# Patient Record
Sex: Female | Born: 1990 | Race: White | Hispanic: No | Marital: Married | Smoking: Former smoker
Health system: Southern US, Community
[De-identification: ages and names within clinical notes are randomized; demographics above are authoritative.]

## PROBLEM LIST (undated history)

## (undated) DIAGNOSIS — F419 Anxiety disorder, unspecified: Secondary | ICD-10-CM

## (undated) HISTORY — PX: DENTAL SURGERY: SHX609

---

## 2012-02-11 DIAGNOSIS — O321XX0 Maternal care for breech presentation, not applicable or unspecified: Secondary | ICD-10-CM

## 2019-08-19 ENCOUNTER — Encounter (HOSPITAL_COMMUNITY)
Admission: EM | Disposition: A | Discharge status: Home / Self Care | Payer: Self-pay | Source: Home / Self Care | Attending: Emergency Medicine

## 2019-08-19 ENCOUNTER — Other Ambulatory Visit: Payer: Self-pay | Admitting: Obstetrics & Gynecology

## 2019-08-19 ENCOUNTER — Encounter (HOSPITAL_COMMUNITY): Payer: Self-pay

## 2019-08-19 ENCOUNTER — Emergency Department (HOSPITAL_COMMUNITY): Payer: Medicaid Other | Admitting: Certified Registered Nurse Anesthetist

## 2019-08-19 ENCOUNTER — Other Ambulatory Visit: Payer: Self-pay

## 2019-08-19 ENCOUNTER — Emergency Department (HOSPITAL_COMMUNITY): Payer: Medicaid Other

## 2019-08-19 ENCOUNTER — Ambulatory Visit (HOSPITAL_COMMUNITY)
Admission: EM | Admit: 2019-08-19 | Discharge: 2019-08-19 | Disposition: A | Discharge status: Home / Self Care | Payer: Medicaid Other | Attending: Emergency Medicine | Admitting: Emergency Medicine

## 2019-08-19 DIAGNOSIS — Z3A01 Less than 8 weeks gestation of pregnancy: Secondary | ICD-10-CM | POA: Insufficient documentation

## 2019-08-19 DIAGNOSIS — Z79899 Other long term (current) drug therapy: Secondary | ICD-10-CM | POA: Insufficient documentation

## 2019-08-19 DIAGNOSIS — F17200 Nicotine dependence, unspecified, uncomplicated: Secondary | ICD-10-CM | POA: Diagnosis not present

## 2019-08-19 DIAGNOSIS — Z20822 Contact with and (suspected) exposure to covid-19: Secondary | ICD-10-CM | POA: Diagnosis not present

## 2019-08-19 DIAGNOSIS — O219 Vomiting of pregnancy, unspecified: Secondary | ICD-10-CM | POA: Insufficient documentation

## 2019-08-19 DIAGNOSIS — O00102 Left tubal pregnancy without intrauterine pregnancy: Secondary | ICD-10-CM | POA: Insufficient documentation

## 2019-08-19 DIAGNOSIS — O009 Unspecified ectopic pregnancy without intrauterine pregnancy: Secondary | ICD-10-CM

## 2019-08-19 DIAGNOSIS — O99331 Smoking (tobacco) complicating pregnancy, first trimester: Secondary | ICD-10-CM | POA: Diagnosis not present

## 2019-08-19 HISTORY — PX: DIAGNOSTIC LAPAROSCOPY WITH REMOVAL OF ECTOPIC PREGNANCY: SHX6449

## 2019-08-19 LAB — COMPREHENSIVE METABOLIC PANEL
ALT: 14 U/L (ref 0–44)
AST: 16 U/L (ref 15–41)
Albumin: 4.4 g/dL (ref 3.5–5.0)
Alkaline Phosphatase: 61 U/L (ref 38–126)
Anion gap: 6 (ref 5–15)
BUN: 6 mg/dL (ref 6–20)
CO2: 29 mmol/L (ref 22–32)
Calcium: 9.5 mg/dL (ref 8.9–10.3)
Chloride: 103 mmol/L (ref 98–111)
Creatinine, Ser: 0.63 mg/dL (ref 0.44–1.00)
GFR calc Af Amer: >60 mL/min (ref >60)
GFR calc non Af Amer: >60 mL/min (ref >60)
Glucose, Bld: 84 mg/dL (ref 70–99)
Potassium: 4 mmol/L (ref 3.5–5.1)
Sodium: 138 mmol/L (ref 135–145)
Total Bilirubin: 0.5 mg/dL (ref 0.3–1.2)
Total Protein: 8.5 g/dL — ABNORMAL HIGH (ref 6.5–8.1)

## 2019-08-19 LAB — URINALYSIS, ROUTINE W REFLEX MICROSCOPIC
Bilirubin Urine: NEGATIVE
Glucose, UA: NEGATIVE mg/dL
Ketones, ur: NEGATIVE mg/dL
Leukocytes,Ua: NEGATIVE
Nitrite: NEGATIVE
Protein, ur: NEGATIVE mg/dL
Specific Gravity, Urine: 1.004 — ABNORMAL LOW (ref 1.005–1.030)
pH: 8 (ref 5.0–8.0)

## 2019-08-19 LAB — CBC
HCT: 36.8 % (ref 36.0–46.0)
Hemoglobin: 11.8 g/dL — ABNORMAL LOW (ref 12.0–15.0)
MCH: 26.8 pg (ref 26.0–34.0)
MCHC: 32.1 g/dL (ref 30.0–36.0)
MCV: 83.6 fL (ref 80.0–100.0)
Platelets: 260 10*3/uL (ref 150–400)
RBC: 4.4 MIL/uL (ref 3.87–5.11)
RDW: 15.6 % — ABNORMAL HIGH (ref 11.5–15.5)
WBC: 7.4 10*3/uL (ref 4.0–10.5)
nRBC: 0 % (ref 0.0–0.2)

## 2019-08-19 LAB — I-STAT BETA HCG BLOOD, ED (MC, WL, AP ONLY): I-stat hCG, quantitative: >2000 m[IU]/mL — ABNORMAL HIGH (ref <5)

## 2019-08-19 LAB — TYPE AND SCREEN
ABO/RH(D): A POS
Antibody Screen: NEGATIVE

## 2019-08-19 LAB — SARS CORONAVIRUS 2 BY RT PCR (HOSPITAL ORDER, PERFORMED IN ~~LOC~~ HOSPITAL LAB): SARS Coronavirus 2: NEGATIVE

## 2019-08-19 LAB — WET PREP, GENITAL
Clue Cells Wet Prep HPF POC: NONE SEEN
Sperm: NONE SEEN
Trich, Wet Prep: NONE SEEN

## 2019-08-19 LAB — LIPASE, BLOOD: Lipase: 22 U/L (ref 11–51)

## 2019-08-19 LAB — HCG, QUANTITATIVE, PREGNANCY: hCG, Beta Chain, Quant, S: 4507 m[IU]/mL — ABNORMAL HIGH (ref <5)

## 2019-08-19 LAB — ABO/RH: ABO/RH(D): A POS

## 2019-08-19 SURGERY — LAPAROSCOPY, WITH ECTOPIC PREGNANCY SURGICAL TREATMENT
Anesthesia: General | Site: Abdomen

## 2019-08-19 MED ORDER — LACTATED RINGERS IV SOLN: INTRAVENOUS | Status: DC | PRN

## 2019-08-19 MED ORDER — MIDAZOLAM HCL 5 MG/5ML IJ SOLN
INTRAMUSCULAR | Status: DC | PRN
  Administered 2019-08-19: 2 mg via INTRAVENOUS

## 2019-08-19 MED ORDER — SODIUM CHLORIDE 0.9% FLUSH: 3.0000 mL | Freq: Once | INTRAVENOUS | Status: DC

## 2019-08-19 MED ORDER — LIDOCAINE 2% (20 MG/ML) 5 ML SYRINGE
INTRAMUSCULAR | Status: DC | PRN
  Administered 2019-08-19: 40 mg via INTRAVENOUS

## 2019-08-19 MED ORDER — ONDANSETRON HCL 4 MG/2ML IJ SOLN
INTRAMUSCULAR | Status: DC | PRN
  Administered 2019-08-19: 4 mg via INTRAVENOUS

## 2019-08-19 MED ORDER — GLYCOPYRROLATE PF 0.2 MG/ML IJ SOSY
PREFILLED_SYRINGE | INTRAMUSCULAR | Status: DC | PRN
  Administered 2019-08-19: .2 mg via INTRAVENOUS

## 2019-08-19 MED ORDER — SODIUM CHLORIDE 0.9 % IR SOLN
Status: DC | PRN
  Administered 2019-08-19: 3000 mL

## 2019-08-19 MED ORDER — PHENYLEPHRINE HCL (PRESSORS) 10 MG/ML IV SOLN
INTRAVENOUS | Status: DC | PRN
  Administered 2019-08-19 (×2): 80 ug via INTRAVENOUS

## 2019-08-19 MED ORDER — ONDANSETRON HCL 4 MG/2ML IJ SOLN: 4.0000 mg | Freq: Four times a day (QID) | INTRAMUSCULAR | Status: DC | PRN

## 2019-08-19 MED ORDER — BUPIVACAINE HCL (PF) 0.25 % IJ SOLN
INTRAMUSCULAR | Status: DC | PRN
  Administered 2019-08-19: 8 mL

## 2019-08-19 MED ORDER — FENTANYL CITRATE (PF) 100 MCG/2ML IJ SOLN: 50.0000 ug | Freq: Once | INTRAMUSCULAR | Status: DC | PRN

## 2019-08-19 MED ORDER — ONDANSETRON HCL 4 MG/2ML IJ SOLN: 4.0000 mg | Freq: Once | INTRAMUSCULAR | Status: DC | PRN

## 2019-08-19 MED ORDER — ROCURONIUM BROMIDE 10 MG/ML (PF) SYRINGE
PREFILLED_SYRINGE | INTRAVENOUS | Status: DC | PRN
  Administered 2019-08-19: 50 mg via INTRAVENOUS

## 2019-08-19 MED ORDER — OXYCODONE HCL 5 MG PO TABS: 5.0000 mg | ORAL_TABLET | Freq: Once | ORAL | Status: DC | PRN

## 2019-08-19 MED ORDER — PROPOFOL 10 MG/ML IV BOLUS
INTRAVENOUS | Status: DC | PRN
  Administered 2019-08-19: 150 mg via INTRAVENOUS

## 2019-08-19 MED ORDER — EPHEDRINE SULFATE 50 MG/ML IJ SOLN
INTRAMUSCULAR | Status: DC | PRN
Start: 2019-08-19 — End: 2019-08-19
  Administered 2019-08-19 (×2): 5 mg via INTRAVENOUS

## 2019-08-19 MED ORDER — OXYCODONE-ACETAMINOPHEN 5-325 MG PO TABS: 1.0000 | ORAL_TABLET | Freq: Four times a day (QID) | ORAL | 0 refills | Status: DC | PRN

## 2019-08-19 MED ORDER — SUGAMMADEX SODIUM 200 MG/2ML IV SOLN
INTRAVENOUS | Status: DC | PRN
Start: 2019-08-19 — End: 2019-08-19
  Administered 2019-08-19: 200 mg via INTRAVENOUS

## 2019-08-19 MED ORDER — FENTANYL CITRATE (PF) 100 MCG/2ML IJ SOLN: 25.0000 ug | INTRAMUSCULAR | Status: DC | PRN

## 2019-08-19 MED ORDER — FENTANYL CITRATE (PF) 100 MCG/2ML IJ SOLN
INTRAMUSCULAR | Status: DC | PRN
  Administered 2019-08-19 (×2): 50 ug via INTRAVENOUS
  Administered 2019-08-19: 100 ug via INTRAVENOUS
  Administered 2019-08-19: 50 ug via INTRAVENOUS

## 2019-08-19 MED ORDER — OXYCODONE HCL 5 MG/5ML PO SOLN: 5.0000 mg | Freq: Once | ORAL | Status: DC | PRN

## 2019-08-19 MED ORDER — IBUPROFEN 600 MG PO TABS
600.0000 mg | ORAL_TABLET | Freq: Four times a day (QID) | ORAL | 1 refills | Status: DC | PRN
Start: 2019-08-19 — End: 2019-09-12

## 2019-08-19 MED ORDER — SUCCINYLCHOLINE 20MG/ML (10ML) SYRINGE FOR MEDFUSION PUMP - OPTIME
INTRAMUSCULAR | Status: DC | PRN
  Administered 2019-08-19: 100 mg via INTRAVENOUS

## 2019-08-19 SURGICAL SUPPLY — 31 items
CABLE HIGH FREQUENCY MONO STRZ (ELECTRODE) IMPLANT
CLOSURE WOUND 1/2 X4 (GAUZE/BANDAGES/DRESSINGS)
DERMABOND ADVANCED (GAUZE/BANDAGES/DRESSINGS) ×2
DERMABOND ADVANCED .7 DNX12 (GAUZE/BANDAGES/DRESSINGS) ×1 IMPLANT
DURAPREP 26ML APPLICATOR (WOUND CARE) ×3 IMPLANT
GLOVE BIO SURGEON STRL SZ 6.5 (GLOVE) ×2 IMPLANT
GLOVE BIO SURGEONS STRL SZ 6.5 (GLOVE) ×1
GLOVE BIOGEL PI IND STRL 7.0 (GLOVE) ×4 IMPLANT
GLOVE BIOGEL PI INDICATOR 7.0 (GLOVE) ×8
GOWN STRL REUS W/ TWL LRG LVL3 (GOWN DISPOSABLE) ×2 IMPLANT
GOWN STRL REUS W/TWL LRG LVL3 (GOWN DISPOSABLE) ×4
KIT TURNOVER KIT B (KITS) ×3 IMPLANT
NEEDLE INSUFFLATION 14GA 120MM (NEEDLE) ×3 IMPLANT
NS IRRIG 1000ML POUR BTL (IV SOLUTION) ×3 IMPLANT
PACK LAPAROSCOPY BASIN (CUSTOM PROCEDURE TRAY) ×3 IMPLANT
PACK TRENDGUARD 450 HYBRID PRO (MISCELLANEOUS) ×1 IMPLANT
POUCH SPECIMEN RETRIEVAL 10MM (ENDOMECHANICALS) ×3 IMPLANT
PROTECTOR NERVE ULNAR (MISCELLANEOUS) ×3 IMPLANT
SET IRRIG TUBING LAPAROSCOPIC (IRRIGATION / IRRIGATOR) ×3 IMPLANT
SET TUBE SMOKE EVAC HIGH FLOW (TUBING) ×3 IMPLANT
SHEARS HARMONIC ACE PLUS 36CM (ENDOMECHANICALS) ×3 IMPLANT
SLEEVE ENDOPATH XCEL 5M (ENDOMECHANICALS) ×3 IMPLANT
SLEEVE SCD COMPRESS KNEE MED (MISCELLANEOUS) ×3 IMPLANT
STRIP CLOSURE SKIN 1/2X4 (GAUZE/BANDAGES/DRESSINGS) IMPLANT
SUT VICRYL 0 UR6 27IN ABS (SUTURE) ×6 IMPLANT
SUT VICRYL 4-0 PS2 18IN ABS (SUTURE) ×3 IMPLANT
TOWEL GREEN STERILE FF (TOWEL DISPOSABLE) ×6 IMPLANT
TRAY FOLEY W/BAG SLVR 14FR (SET/KITS/TRAYS/PACK) ×3 IMPLANT
TRENDGUARD 450 HYBRID PRO PACK (MISCELLANEOUS) ×3
TROCAR XCEL DIL TIP R 11M (ENDOMECHANICALS) ×3 IMPLANT
WARMER LAPAROSCOPE (MISCELLANEOUS) ×3 IMPLANT

## 2019-08-19 NOTE — Progress Notes (Signed)
Tracy Cooper is an 29 y.o. female. G4P3  Patient's last menstrual period was 06/19/2019. Transferred from [facility] with US showing suspected ruptured ectopic pregnancy. She is to have LS removal of the ectopic and she consents to salpingectomy, and would prefer sterilization but insurance will not cover this.   Pertinent Gynecological History: Menses: flow is light and regular every month without intermenstrual spotting Contraception: none Blood transfusions: none Sexually transmitted diseases: no past history Last pap: normal   Menstrual History:  Patient's last menstrual period was 06/19/2019.    History reviewed. No pertinent past medical history.  Past Surgical History:  Procedure Laterality Date  . CESAREAN SECTION     x 3    Family History  Problem Relation Age of Onset  . COPD Mother     Social History:  reports that she has been smoking. She has never used smokeless tobacco. She reports that she does not drink alcohol and does not use drugs.  Allergies: No Known Allergies  No medications prior to admission.    Review of Systems  Constitutional: Negative.   Gastrointestinal: Positive for abdominal pain.  Genitourinary: Positive for pelvic pain and vaginal bleeding (spotting).    Blood pressure 137/79, pulse 73, temperature (!) 97.5 F (36.4 C), temperature source Oral, resp. rate 18, height 5\' 2"  (1.575 m), weight 78.9 kg, last menstrual period 06/19/2019, SpO2 100 %. Physical Exam Vitals and nursing note reviewed. Exam conducted with a chaperone present.  Cardiovascular:     Rate and Rhythm: Normal rate.  Pulmonary:     Effort: Pulmonary effort is normal.  Abdominal:     General: Abdomen is flat. A surgical scar is present.     Palpations: Abdomen is soft.  Skin:    General: Skin is warm and dry.  Neurological:     Mental Status: She is alert.  Psychiatric:        Mood and Affect: Mood normal.        Behavior: Behavior normal.     Results  for orders placed or performed during the hospital encounter of 08/19/19 (from the past 24 hour(s))  Urinalysis, Routine w reflex microscopic     Status: Abnormal   Collection Time: 08/19/19  3:33 PM  Result Value Ref Range   Color, Urine YELLOW YELLOW   APPearance CLEAR CLEAR   Specific Gravity, Urine 1.004 (L) 1.005 - 1.030   pH 8.0 5.0 - 8.0   Glucose, UA NEGATIVE NEGATIVE mg/dL   Hgb urine dipstick LARGE (A) NEGATIVE   Bilirubin Urine NEGATIVE NEGATIVE   Ketones, ur NEGATIVE NEGATIVE mg/dL   Protein, ur NEGATIVE NEGATIVE mg/dL   Nitrite NEGATIVE NEGATIVE   Leukocytes,Ua NEGATIVE NEGATIVE   RBC / HPF 21-50 0 - 5 RBC/hpf   WBC, UA 0-5 0 - 5 WBC/hpf   Bacteria, UA RARE (A) NONE SEEN   Squamous Epithelial / LPF 0-5 0 - 5   Ca Oxalate Crys, UA PRESENT   Lipase, blood     Status: None   Collection Time: 08/19/19  3:43 PM  Result Value Ref Range   Lipase 22 11 - 51 U/L  Comprehensive metabolic panel     Status: Abnormal   Collection Time: 08/19/19  3:43 PM  Result Value Ref Range   Sodium 138 135 - 145 mmol/L   Potassium 4.0 3.5 - 5.1 mmol/L   Chloride 103 98 - 111 mmol/L   CO2 29 22 - 32 mmol/L   Glucose, Bld 84 70 - 99  mg/dL   BUN 6 6 - 20 mg/dL   Creatinine, Ser 7.67 0.44 - 1.00 mg/dL   Calcium 9.5 8.9 - 34.1 mg/dL   Total Protein 8.5 (H) 6.5 - 8.1 g/dL   Albumin 4.4 3.5 - 5.0 g/dL   AST 16 15 - 41 U/L   ALT 14 0 - 44 U/L   Alkaline Phosphatase 61 38 - 126 U/L   Total Bilirubin 0.5 0.3 - 1.2 mg/dL   GFR calc non Af Amer >60 >60 mL/min   GFR calc Af Amer >60 >60 mL/min   Anion gap 6 5 - 15  CBC     Status: Abnormal   Collection Time: 08/19/19  3:43 PM  Result Value Ref Range   WBC 7.4 4.0 - 10.5 K/uL   RBC 4.40 3.87 - 5.11 MIL/uL   Hemoglobin 11.8 (L) 12.0 - 15.0 g/dL   HCT 93.7 36 - 46 %   MCV 83.6 80.0 - 100.0 fL   MCH 26.8 26.0 - 34.0 pg   MCHC 32.1 30.0 - 36.0 g/dL   RDW 90.2 (H) 40.9 - 73.5 %   Platelets 260 150 - 400 K/uL   nRBC 0.0 0.0 - 0.2 %  hCG,  quantitative, pregnancy     Status: Abnormal   Collection Time: 08/19/19  3:43 PM  Result Value Ref Range   hCG, Beta Chain, Quant, S 4,507 (H) <5 mIU/mL  I-Stat beta hCG blood, ED     Status: Abnormal   Collection Time: 08/19/19  3:48 PM  Result Value Ref Range   I-stat hCG, quantitative >2,000.0 (H) <5 mIU/mL   Comment 3          Wet prep, genital     Status: Abnormal   Collection Time: 08/19/19  4:44 PM   Specimen: PATH Cytology Cervicovaginal Ancillary Only  Result Value Ref Range   Yeast Wet Prep HPF POC PRESENT (A) NONE SEEN   Trich, Wet Prep NONE SEEN NONE SEEN   Clue Cells Wet Prep HPF POC NONE SEEN NONE SEEN   WBC, Wet Prep HPF POC FEW (A) NONE SEEN   Sperm NONE SEEN   SARS Coronavirus 2 by RT PCR (hospital order, performed in [facility] hospital lab) Nasopharyngeal Nasopharyngeal Swab     Status: None   Collection Time: 08/19/19  6:10 PM   Specimen: Nasopharyngeal Swab  Result Value Ref Range   SARS Coronavirus 2 NEGATIVE NEGATIVE    US OB Comp < 14 Wks  Result Date: 08/19/2019 CLINICAL DATA:  Pelvic pain and vaginal bleeding for 3 days. Beta HCG 4,507. Last menstrual period 06/19/2019. EXAM: OBSTETRIC <14 WK [place] AND TRANSVAGINAL OB US TECHNIQUE: Both transabdominal and transvaginal ultrasound examinations were performed for complete evaluation of the gestation as well as the maternal uterus, adnexal regions, and pelvic cul-de-sac. Transvaginal technique was performed to assess early pregnancy. COMPARISON:  None. FINDINGS: Intrauterine gestational sac: None Yolk sac:  Not Visualized. Embryo:  Not Visualized. Cardiac Activity: Not Visualized. Subchorionic hemorrhage:  None visualized. Maternal uterus/adnexae: The endometrium measures 14 mm in thickness. A right ovarian cyst versus dominant follicle measures 2.1 x 1.6 x 1.9 cm. A rounded structure is seen in the left adnexa measuring 3.2 x 2.5 cm. There is moderate volume free intraperitoneal fluid with debris, concerning for blood.  IMPRESSION: No intrauterine gestational sac is identified. A rounded structure is seen in the left adnexa and complex fluid is seen within the pelvis which is concerning for blood products. This constellation  of findings is suspicious for a ruptured ectopic pregnancy. These results were called by telephone at the time of interpretation on 08/19/2019 at 6:02 pm to provider ABIGAIL HARRIS , who verbally acknowledged these results. Electronically Signed   By: Romona Curls M.D.   On: 08/19/2019 18:07   US OB Transvaginal  Result Date: 08/19/2019 CLINICAL DATA:  Pelvic pain and vaginal bleeding for 3 days. Beta HCG 4,507. Last menstrual period 06/19/2019. EXAM: OBSTETRIC <14 WK [place] AND TRANSVAGINAL OB US TECHNIQUE: Both transabdominal and transvaginal ultrasound examinations were performed for complete evaluation of the gestation as well as the maternal uterus, adnexal regions, and pelvic cul-de-sac. Transvaginal technique was performed to assess early pregnancy. COMPARISON:  None. FINDINGS: Intrauterine gestational sac: None Yolk sac:  Not Visualized. Embryo:  Not Visualized. Cardiac Activity: Not Visualized. Subchorionic hemorrhage:  None visualized. Maternal uterus/adnexae: The endometrium measures 14 mm in thickness. A right ovarian cyst versus dominant follicle measures 2.1 x 1.6 x 1.9 cm. A rounded structure is seen in the left adnexa measuring 3.2 x 2.5 cm. There is moderate volume free intraperitoneal fluid with debris, concerning for blood. IMPRESSION: No intrauterine gestational sac is identified. A rounded structure is seen in the left adnexa and complex fluid is seen within the pelvis which is concerning for blood products. This constellation of findings is suspicious for a ruptured ectopic pregnancy. These results were called by telephone at the time of interpretation on 08/19/2019 at 6:02 pm to provider ABIGAIL HARRIS , who verbally acknowledged these results. Electronically Signed   By: Romona Curls M.D.    On: 08/19/2019 18:07    Assessment/Plan: Suspected ruptured ectopic pregnancy. She was transported from [facility] with the plan to perform laparoscopic removal of ectopic pregnancy with possible salpingectomy. Patient desires surgical management .  The risks of surgery were discussed in detail with the patient including but not limited to: bleeding which may require transfusion or reoperation; infection which may require prolonged hospitalization or re-hospitalization and antibiotic therapy; injury to bowel, bladder, ureters and major vessels or other surrounding organs; formation of adhesions; need for additional procedures including laparotomy; thromboembolic phenomenon; incisional problems and other postoperative or anesthesia complications.  Patient was told that the likelihood that her condition and symptoms will be treated effectively with this surgical management was very high; the postoperative expectations were also discussed in detail. The patient also understands the alternative treatment options which were discussed in full. All questions were answered.    Scheryl Darter 08/19/2019, 7:16 PM

## 2019-08-19 NOTE — ED Provider Notes (Signed)
Medical screening examination/treatment/procedure(s) were conducted as a shared visit with non-physician practitioner(s) and myself.  I personally evaluated the patient during the encounter.    Patient has taken a home positive pregnancy test.  LMP [phone].  Patient reports 3 days ago she got some gradual abdominal discomfort and felt a little bit like menstrual cramps.  It was not severe.  Last night she had an episode of severe pain that seem to be somewhat to the left and central abdomen.  It eased somewhat by morning but has persisted and very painful with any movements.  Patient is alert and appropriate.  No respiratory distress.  Mental status clear.  Color good.  Abdomen is soft but very tender in the left lateral and lower quadrant as well as suprapubic.  Ultrasound results pending.  I agree with plan of management.   Arby Barrette, MD 08/19/19 [phone]

## 2019-08-19 NOTE — Discharge Instructions (Signed)
Diagnostic Laparoscopy, Care After This sheet gives you information about how to care for yourself after your procedure. Your health care provider may also give you more specific instructions. If you have problems or questions, contact your health care provider. What can I expect after the procedure? After the procedure, it is common to have:  Mild discomfort in the abdomen.  Sore throat. Women who have laparoscopy with pelvic examination may have mild cramping and fluid coming from the vagina for a few days after the procedure. Follow these instructions at home: Medicines  Take over-the-counter and prescription medicines only as told by your health care provider.  If you were prescribed an antibiotic medicine, take it as told by your health care provider. Do not stop taking the antibiotic even if you start to feel better. Driving  Do not drive for 24 hours if you were given a medicine to help you relax (sedative) during your procedure.  Do not drive or use heavy machinery while taking prescription pain medicine. Bathing  Do not take baths, swim, or use a hot tub until your health care provider approves. You may take showers. Incision care   Follow instructions from your health care provider about how to take care of your incisions. Make sure you: ? Wash your hands with soap and water before you change your bandage (dressing). If soap and water are not available, use hand sanitizer. ? Change your dressing as told by your health care provider. ? Leave stitches (sutures), skin glue, or adhesive strips in place. These skin closures may need to stay in place for 2 weeks or longer. If adhesive strip edges start to loosen and curl up, you may trim the loose edges. Do not remove adhesive strips completely unless your health care provider tells you to do that.  Check your incision areas every day for signs of infection. Check for: ? Redness, swelling, or pain. ? Fluid or  blood. ? Warmth. ? Pus or a bad smell. Activity  Return to your normal activities as told by your health care provider. Ask your health care provider what activities are safe for you.  Do not lift anything that is heavier than 10 lb (4.5 kg), or the limit that you are told, until your health care provider says that it is safe. General instructions  To prevent or treat constipation while you are taking prescription pain medicine, your health care provider may recommend that you: ? Drink enough fluid to keep your urine pale yellow. ? Take over-the-counter or prescription medicines. ? Eat foods that are high in fiber, such as fresh fruits and vegetables, whole grains, and beans. ? Limit foods that are high in fat and processed sugars, such as fried and sweet foods.  Do not use any products that contain nicotine or tobacco, such as cigarettes and e-cigarettes. If you need help quitting, ask your health care provider.  Keep all follow-up visits as told by your health care provider. This is important. Contact a health care provider if:  You develop shoulder pain.  You feel lightheaded or faint.  You are unable to pass gas or have a bowel movement.  You feel nauseous or you vomit.  You develop a rash.  You have redness, swelling, or pain around any incision.  You have fluid or blood coming from any incision.  Any incision feels warm to the touch.  You have pus or a bad smell coming from any incision.  You have a fever or chills. Get help   right away if:  You have severe pain.  You have vomiting that does not go away.  You have heavy bleeding from the vagina.  Any incision opens.  You have trouble breathing.  You have chest pain. Summary  After the procedure, it is common to have mild discomfort in the abdomen and a sore throat.  Check your incision areas every day for signs of infection.  Return to your normal activities as told by your health care provider. Ask  your health care provider what activities are safe for you. This information is not intended to replace advice given to you by your health care provider. Make sure you discuss any questions you have with your health care provider. Document Revised: 01/13/2017 Document Reviewed: 07/27/2016 Elsevier Patient Education  2020 [employer] Ruptured Ectopic Pregnancy  An ectopic pregnancy is when a fertilized egg attaches (implants) outside of the uterus, usually in a fallopian tube. A ruptured ectopic pregnancy is when the fallopian tube tears or bursts. This results in internal bleeding, intense abdominal pain, and sometimes, vaginal bleeding. Most ectopic pregnancies occur in the fallopian tube. In rare cases, it may occur on the ovary, intestine, pelvis, or cervix. An ectopic pregnancy does not have the ability to develop into a normal, healthy baby. A ruptured ectopic pregnancy can affect your ability to have children (fertility), depending on damage it causes to your reproductive organs. Ruptured ectopic pregnancy is a medical emergency. If not treated immediately, it can lead to blood loss, shock, or even death. What are the causes? Most ectopic pregnancies are caused by damage to the fallopian tubes. The damage prevents the fertilized egg from implanting in the uterus. In some cases, the cause may not be known. What increases the risk? You are at increased risk for an ectopic pregnancy if:  You have had a previous ectopic pregnancy.  You have had previous fallopian tube surgery.  You have had previous surgery to have the fallopian tubes tied (tubal ligation).  You have had infertility treatments or have a history of infertility.  You have been exposed to DES. DES is a medicine that was used until 1971 and had effects on babies whose mothers took the medicine.  You use an IUD (intrauterine device) for birth control.  You use progestin-only oral contraception for birth control.  You have  a history of pelvic inflammatory disease (PID).  You have a history of endometriosis.  You smoke.  You became sexually active before 28 years of age.  You have multiple sexual partners. What are the signs or symptoms? Symptoms of a ruptured ectopic pregnancy and internal bleeding may include:  Sudden, severe pain in the abdomen and pelvis.  Dizziness or fainting.  Pain in the shoulder area.  Vaginal bleeding. How is this diagnosed? This condition is diagnosed based on your medical history, symptoms, a physical exam, and tests, which may include:  A pregnancy test.  An ultrasound.  Measuring the levels of the pregnancy hormone in the bloodstream.  Taking a sample of tissue from the uterus (dilation and curettage, D&C).  Surgery to visually examine the inside of the abdomen using a lighted tube (laparoscopy). How is this treated? This condition is treated with IV fluids and emergency surgery to remove the ectopic pregnancy and repair the area where the rupture occured. If you have lost a lot of blood, you may need a blood transfusion. If you are Rh negative and your baby's father is Rh positive, or the Rh type of the  father is unknown, you may receive a Rho (D) immune globulin shot. This is to prevent Rh problems in future pregnancies. Additional medicines may be given. Get help right away if:  You are taking medicines to treat an ectopic pregnancy and you develop symptoms of a rupture. These include: ? Fever or chills. ? Shoulder pain. ? Vaginal bleeding. ? Nausea and vomiting. ? Severe abdominal pain or cramping. ? Feeling light-headed or fainting. Summary  An ectopic pregnancy is when a fertilized egg attaches (implants) outside of the uterus, usually in a fallopian tube. A ruptured ectopic pregnancy is when the fallopian tube tears or bursts.  Ruptured ectopic pregnancy is a medical emergency. If not treated immediately, it can lead to blood loss, shock, or even  death.  This condition is treated with IV fluids and emergency surgery to remove the ectopic pregnancy and repair the area where the rupture occured. If you have lost a lot of blood, you may need a blood transfusion. This information is not intended to replace advice given to you by your health care provider. Make sure you discuss any questions you have with your health care provider. Document Revised: 01/13/2017 Document Reviewed: 04/20/2016 Elsevier Patient Education  2020 [employer].

## 2019-08-19 NOTE — Transfer of Care (Signed)
Immediate Anesthesia Transfer of Care Note  Patient: Tracy Cooper  Procedure(s) Performed: DIAGNOSTIC LAPAROSCOPY WITH Left Salpingectomy and  REMOVAL OF ECTOPIC PREGNANCY (N/A Abdomen)  Patient Location: PACU  Anesthesia Type:General  Level of Consciousness: awake, alert , oriented and patient cooperative  Airway & Oxygen Therapy: Patient Spontanous Breathing  Post-op Assessment: Report given to RN and Post -op Vital signs reviewed and stable  Post vital signs: Reviewed and stable  Last Vitals:  Vitals Value Taken Time  BP 108/56   Temp    Pulse 75 08/19/19 2121  Resp 19 08/19/19 2121  SpO2 100 % 08/19/19 2121  Vitals shown include unvalidated device data.  Last Pain:  Vitals:   08/19/19 1822  TempSrc:   PainSc: 7          Complications: No complications documented.

## 2019-08-19 NOTE — ED Notes (Signed)
[facility] OR given report.

## 2019-08-19 NOTE — H&P (View-Only) (Signed)
Tracy Cooper is an 29 y.o. female. G4P3  Patient's last menstrual period was 06/19/2019. Transferred from [facility] showing suspected ruptured ectopic pregnancy. She is to have LS removal of the ectopic and she consents to salpingectomy, and would prefer sterilization but insurance will not cover this.   Pertinent Gynecological History: Menses: flow is light and regular every month without intermenstrual spotting Contraception: none Blood transfusions: none Sexually transmitted diseases: no past history Last pap: normal   Menstrual History:  Patient's last menstrual period was 06/19/2019.    History reviewed. No pertinent past medical history.  Past Surgical History:  Procedure Laterality Date  . CESAREAN SECTION     x 3    Family History  Problem Relation Age of Onset  . COPD Mother     Social History:  reports that she has been smoking. She has never used smokeless tobacco. She reports that she does not drink alcohol and does not use drugs.  Allergies: No Known Allergies  No medications prior to admission.    Review of Systems  Constitutional: Negative.   Gastrointestinal: Positive for abdominal pain.  Genitourinary: Positive for pelvic pain and vaginal bleeding (spotting).    Blood pressure 137/79, pulse 73, temperature (!) 97.5 F (36.4 C), temperature source Oral, resp. rate 18, height 5' 2" (1.575 m), weight 78.9 kg, last menstrual period 06/19/2019, SpO2 100 %. Physical Exam Vitals and nursing note reviewed. Exam conducted with a chaperone present.  Cardiovascular:     Rate and Rhythm: Normal rate.  Pulmonary:     Effort: Pulmonary effort is normal.  Abdominal:     General: Abdomen is flat. A surgical scar is present.     Palpations: Abdomen is soft.  Skin:    General: Skin is warm and dry.  Neurological:     Mental Status: She is alert.  Psychiatric:        Mood and Affect: Mood normal.        Behavior: Behavior normal.     Results  for orders placed or performed during the hospital encounter of 08/19/19 (from the past 24 hour(s))  Urinalysis, Routine w reflex microscopic     Status: Abnormal   Collection Time: 08/19/19  3:33 PM  Result Value Ref Range   Color, Urine YELLOW YELLOW   APPearance CLEAR CLEAR   Specific Gravity, Urine 1.004 (L) 1.005 - 1.030   pH 8.0 5.0 - 8.0   Glucose, UA NEGATIVE NEGATIVE mg/dL   Hgb urine dipstick LARGE (A) NEGATIVE   Bilirubin Urine NEGATIVE NEGATIVE   Ketones, ur NEGATIVE NEGATIVE mg/dL   Protein, ur NEGATIVE NEGATIVE mg/dL   Nitrite NEGATIVE NEGATIVE   Leukocytes,Ua NEGATIVE NEGATIVE   RBC / HPF 21-50 0 - 5 RBC/hpf   WBC, UA 0-5 0 - 5 WBC/hpf   Bacteria, UA RARE (A) NONE SEEN   Squamous Epithelial / LPF 0-5 0 - 5   Ca Oxalate Crys, UA PRESENT   Lipase, blood     Status: None   Collection Time: 08/19/19  3:43 PM  Result Value Ref Range   Lipase 22 11 - 51 U/L  Comprehensive metabolic panel     Status: Abnormal   Collection Time: 08/19/19  3:43 PM  Result Value Ref Range   Sodium 138 135 - 145 mmol/L   Potassium 4.0 3.5 - 5.1 mmol/L   Chloride 103 98 - 111 mmol/L   CO2 29 22 - 32 mmol/L   Glucose, Bld 84 70 - 99   mg/dL   BUN 6 6 - 20 mg/dL   Creatinine, Ser 7.67 0.44 - 1.00 mg/dL   Calcium 9.5 8.9 - 34.1 mg/dL   Total Protein 8.5 (H) 6.5 - 8.1 g/dL   Albumin 4.4 3.5 - 5.0 g/dL   AST 16 15 - 41 U/L   ALT 14 0 - 44 U/L   Alkaline Phosphatase 61 38 - 126 U/L   Total Bilirubin 0.5 0.3 - 1.2 mg/dL   GFR calc non Af Amer >60 >60 mL/min   GFR calc Af Amer >60 >60 mL/min   Anion gap 6 5 - 15  CBC     Status: Abnormal   Collection Time: 08/19/19  3:43 PM  Result Value Ref Range   WBC 7.4 4.0 - 10.5 K/uL   RBC 4.40 3.87 - 5.11 MIL/uL   Hemoglobin 11.8 (L) 12.0 - 15.0 g/dL   HCT 93.7 36 - 46 %   MCV 83.6 80.0 - 100.0 fL   MCH 26.8 26.0 - 34.0 pg   MCHC 32.1 30.0 - 36.0 g/dL   RDW 90.2 (H) 40.9 - 73.5 %   Platelets 260 150 - 400 K/uL   nRBC 0.0 0.0 - 0.2 %  hCG,  quantitative, pregnancy     Status: Abnormal   Collection Time: 08/19/19  3:43 PM  Result Value Ref Range   hCG, Beta Chain, Quant, S 4,507 (H) <5 mIU/mL  I-Stat beta hCG blood, ED     Status: Abnormal   Collection Time: 08/19/19  3:48 PM  Result Value Ref Range   I-stat hCG, quantitative >2,000.0 (H) <5 mIU/mL   Comment 3          Wet prep, genital     Status: Abnormal   Collection Time: 08/19/19  4:44 PM   Specimen: PATH Cytology Cervicovaginal Ancillary Only  Result Value Ref Range   Yeast Wet Prep HPF POC PRESENT (A) NONE SEEN   Trich, Wet Prep NONE SEEN NONE SEEN   Clue Cells Wet Prep HPF POC NONE SEEN NONE SEEN   WBC, Wet Prep HPF POC FEW (A) NONE SEEN   Sperm NONE SEEN   SARS Coronavirus 2 by RT PCR (hospital order, performed in [facility] hospital lab) Nasopharyngeal Nasopharyngeal Swab     Status: None   Collection Time: 08/19/19  6:10 PM   Specimen: Nasopharyngeal Swab  Result Value Ref Range   SARS Coronavirus 2 NEGATIVE NEGATIVE    US OB Comp < 14 Wks  Result Date: 08/19/2019 CLINICAL DATA:  Pelvic pain and vaginal bleeding for 3 days. Beta HCG 4,507. Last menstrual period 06/19/2019. EXAM: OBSTETRIC <14 WK [place] AND TRANSVAGINAL OB US TECHNIQUE: Both transabdominal and transvaginal ultrasound examinations were performed for complete evaluation of the gestation as well as the maternal uterus, adnexal regions, and pelvic cul-de-sac. Transvaginal technique was performed to assess early pregnancy. COMPARISON:  None. FINDINGS: Intrauterine gestational sac: None Yolk sac:  Not Visualized. Embryo:  Not Visualized. Cardiac Activity: Not Visualized. Subchorionic hemorrhage:  None visualized. Maternal uterus/adnexae: The endometrium measures 14 mm in thickness. A right ovarian cyst versus dominant follicle measures 2.1 x 1.6 x 1.9 cm. A rounded structure is seen in the left adnexa measuring 3.2 x 2.5 cm. There is moderate volume free intraperitoneal fluid with debris, concerning for blood.  IMPRESSION: No intrauterine gestational sac is identified. A rounded structure is seen in the left adnexa and complex fluid is seen within the pelvis which is concerning for blood products. This constellation  of findings is suspicious for a ruptured ectopic pregnancy. These results were called by telephone at the time of interpretation on 08/19/2019 at 6:02 pm to provider ABIGAIL HARRIS , who verbally acknowledged these results. Electronically Signed   By: Romona Curls M.D.   On: 08/19/2019 18:07   US OB Transvaginal  Result Date: 08/19/2019 CLINICAL DATA:  Pelvic pain and vaginal bleeding for 3 days. Beta HCG 4,507. Last menstrual period 06/19/2019. EXAM: OBSTETRIC <14 WK [place] AND TRANSVAGINAL OB US TECHNIQUE: Both transabdominal and transvaginal ultrasound examinations were performed for complete evaluation of the gestation as well as the maternal uterus, adnexal regions, and pelvic cul-de-sac. Transvaginal technique was performed to assess early pregnancy. COMPARISON:  None. FINDINGS: Intrauterine gestational sac: None Yolk sac:  Not Visualized. Embryo:  Not Visualized. Cardiac Activity: Not Visualized. Subchorionic hemorrhage:  None visualized. Maternal uterus/adnexae: The endometrium measures 14 mm in thickness. A right ovarian cyst versus dominant follicle measures 2.1 x 1.6 x 1.9 cm. A rounded structure is seen in the left adnexa measuring 3.2 x 2.5 cm. There is moderate volume free intraperitoneal fluid with debris, concerning for blood. IMPRESSION: No intrauterine gestational sac is identified. A rounded structure is seen in the left adnexa and complex fluid is seen within the pelvis which is concerning for blood products. This constellation of findings is suspicious for a ruptured ectopic pregnancy. These results were called by telephone at the time of interpretation on 08/19/2019 at 6:02 pm to provider ABIGAIL HARRIS , who verbally acknowledged these results. Electronically Signed   By: Romona Curls M.D.    On: 08/19/2019 18:07    Assessment/Plan: Suspected ruptured ectopic pregnancy. She was transported from [facility] with the plan to perform laparoscopic removal of ectopic pregnancy with possible salpingectomy. Patient desires surgical management .  The risks of surgery were discussed in detail with the patient including but not limited to: bleeding which may require transfusion or reoperation; infection which may require prolonged hospitalization or re-hospitalization and antibiotic therapy; injury to bowel, bladder, ureters and major vessels or other surrounding organs; formation of adhesions; need for additional procedures including laparotomy; thromboembolic phenomenon; incisional problems and other postoperative or anesthesia complications.  Patient was told that the likelihood that her condition and symptoms will be treated effectively with this surgical management was very high; the postoperative expectations were also discussed in detail. The patient also understands the alternative treatment options which were discussed in full. All questions were answered.    Scheryl Darter 08/19/2019, 7:16 PM

## 2019-08-19 NOTE — ED Notes (Signed)
[facility] given report.  

## 2019-08-19 NOTE — Interval H&P Note (Signed)
History and Physical Interval Note:  08/19/2019 7:29 PM  Tracy Cooper  has presented today for surgery, with the diagnosis of ABDOMINAL PAIN.  The various methods of treatment have been discussed with the patient and family. After consideration of risks, benefits and other options for treatment, the patient has consented to  Procedure(s): DIAGNOSTIC LAPAROSCOPY WITH REMOVAL OF ECTOPIC PREGNANCY (N/A) as a surgical intervention.  The patient's history has been reviewed, patient examined, no change in status, stable for surgery.  I have reviewed the patient's chart and labs.  Questions were answered to the patient's satisfaction.     Scheryl Darter

## 2019-08-19 NOTE — Anesthesia Procedure Notes (Signed)
Procedure Name: Intubation Date/Time: 08/19/2019 7:50 PM Performed by: Oletta Lamas, CRNA Pre-anesthesia Checklist: Patient identified, Emergency Drugs available, Suction available and Patient being monitored Patient Re-evaluated:Patient Re-evaluated prior to induction Oxygen Delivery Method: Circle System Utilized Preoxygenation: Pre-oxygenation with 100% oxygen Induction Type: IV induction Ventilation: Mask ventilation without difficulty Laryngoscope Size: Mac and 3 Grade View: Grade I Tube type: Oral Number of attempts: 1 Airway Equipment and Method: Stylet Placement Confirmation: ETT inserted through vocal cords under direct vision,  positive ETCO2 and breath sounds checked- equal and bilateral Secured at: 22 cm Tube secured with: Tape Dental Injury: Teeth and Oropharynx as per pre-operative assessment

## 2019-08-19 NOTE — ED Notes (Signed)
[facility] called, Tracy Cooper accepting. Patient to go to [facility] OR per PA. Papers at bedside.

## 2019-08-19 NOTE — Progress Notes (Deleted)
Tracy Cooper is an 29 y.o. female. No obstetric history on file. Transferred from [facility] for ruptured ectopic pregnancy., Patient's last menstrual period was 06/19/2019.  She consents to salpingectomy and does not want to conceive again. Pertinent Gynecological History:   Patient's last menstrual period was 06/19/2019.    History reviewed. No pertinent past medical history.  Past Surgical History:  Procedure Laterality Date  . CESAREAN SECTION     x 3    Family History  Problem Relation Age of Onset  . COPD Mother     Social History:  reports that she has been smoking. She has never used smokeless tobacco. She reports that she does not drink alcohol and does not use drugs.  Allergies: No Known Allergies  No medications prior to admission.    Review of Systems  Constitutional: Negative.   Gastrointestinal: Positive for abdominal pain.  Genitourinary: Positive for pelvic pain and vaginal bleeding (spotting).    Blood pressure 137/79, pulse 73, temperature (!) 97.5 F (36.4 C), temperature source Oral, resp. rate 18, height 5\' 2"  (1.575 m), weight 78.9 kg, last menstrual period 06/19/2019, SpO2 100 %. Physical Exam Vitals and nursing note reviewed.  Constitutional:      Appearance: She is well-developed.  HENT:     Head: Normocephalic.  Cardiovascular:     Rate and Rhythm: Normal rate.  Abdominal:     General: Abdomen is flat. A surgical scar is present.     Palpations: Abdomen is soft.  Skin:    General: Skin is warm and dry.  Neurological:     Mental Status: She is alert.     Results for orders placed or performed during the hospital encounter of 08/19/19 (from the past 24 hour(s))  Urinalysis, Routine w reflex microscopic     Status: Abnormal   Collection Time: 08/19/19  3:33 PM  Result Value Ref Range   Color, Urine YELLOW YELLOW   APPearance CLEAR CLEAR   Specific Gravity, Urine 1.004 (L) 1.005 - 1.030   pH 8.0 5.0 - 8.0   Glucose, UA NEGATIVE  NEGATIVE mg/dL   Hgb urine dipstick LARGE (A) NEGATIVE   Bilirubin Urine NEGATIVE NEGATIVE   Ketones, ur NEGATIVE NEGATIVE mg/dL   Protein, ur NEGATIVE NEGATIVE mg/dL   Nitrite NEGATIVE NEGATIVE   Leukocytes,Ua NEGATIVE NEGATIVE   RBC / HPF 21-50 0 - 5 RBC/hpf   WBC, UA 0-5 0 - 5 WBC/hpf   Bacteria, UA RARE (A) NONE SEEN   Squamous Epithelial / LPF 0-5 0 - 5   Ca Oxalate Crys, UA PRESENT   Lipase, blood     Status: None   Collection Time: 08/19/19  3:43 PM  Result Value Ref Range   Lipase 22 11 - 51 U/L  Comprehensive metabolic panel     Status: Abnormal   Collection Time: 08/19/19  3:43 PM  Result Value Ref Range   Sodium 138 135 - 145 mmol/L   Potassium 4.0 3.5 - 5.1 mmol/L   Chloride 103 98 - 111 mmol/L   CO2 29 22 - 32 mmol/L   Glucose, Bld 84 70 - 99 mg/dL   BUN 6 6 - 20 mg/dL   Creatinine, Ser 10/20/19 0.44 - 1.00 mg/dL   Calcium 9.5 8.9 - 3.54 mg/dL   Total Protein 8.5 (H) 6.5 - 8.1 g/dL   Albumin 4.4 3.5 - 5.0 g/dL   AST 16 15 - 41 U/L   ALT 14 0 - 44 U/L   Alkaline Phosphatase  61 38 - 126 U/L   Total Bilirubin 0.5 0.3 - 1.2 mg/dL   GFR calc non Af Amer >60 >60 mL/min   GFR calc Af Amer >60 >60 mL/min   Anion gap 6 5 - 15  CBC     Status: Abnormal   Collection Time: 08/19/19  3:43 PM  Result Value Ref Range   WBC 7.4 4.0 - 10.5 K/uL   RBC 4.40 3.87 - 5.11 MIL/uL   Hemoglobin 11.8 (L) 12.0 - 15.0 g/dL   HCT 37.8 36 - 46 %   MCV 83.6 80.0 - 100.0 fL   MCH 26.8 26.0 - 34.0 pg   MCHC 32.1 30.0 - 36.0 g/dL   RDW 58.8 (H) 50.2 - 77.4 %   Platelets 260 150 - 400 K/uL   nRBC 0.0 0.0 - 0.2 %  hCG, quantitative, pregnancy     Status: Abnormal   Collection Time: 08/19/19  3:43 PM  Result Value Ref Range   hCG, Beta Chain, Quant, S 4,507 (H) <5 mIU/mL  I-Stat beta hCG blood, ED     Status: Abnormal   Collection Time: 08/19/19  3:48 PM  Result Value Ref Range   I-stat hCG, quantitative >2,000.0 (H) <5 mIU/mL   Comment 3          Wet prep, genital     Status: Abnormal    Collection Time: 08/19/19  4:44 PM   Specimen: PATH Cytology Cervicovaginal Ancillary Only  Result Value Ref Range   Yeast Wet Prep HPF POC PRESENT (A) NONE SEEN   Trich, Wet Prep NONE SEEN NONE SEEN   Clue Cells Wet Prep HPF POC NONE SEEN NONE SEEN   WBC, Wet Prep HPF POC FEW (A) NONE SEEN   Sperm NONE SEEN   SARS Coronavirus 2 by RT PCR (hospital order, performed in [facility] hospital lab) Nasopharyngeal Nasopharyngeal Swab     Status: None   Collection Time: 08/19/19  6:10 PM   Specimen: Nasopharyngeal Swab  Result Value Ref Range   SARS Coronavirus 2 NEGATIVE NEGATIVE    US OB Comp < 14 Wks  Result Date: 08/19/2019 CLINICAL DATA:  Pelvic pain and vaginal bleeding for 3 days. Beta HCG 4,507. Last menstrual period 06/19/2019. EXAM: OBSTETRIC <14 WK [place] AND TRANSVAGINAL OB US TECHNIQUE: Both transabdominal and transvaginal ultrasound examinations were performed for complete evaluation of the gestation as well as the maternal uterus, adnexal regions, and pelvic cul-de-sac. Transvaginal technique was performed to assess early pregnancy. COMPARISON:  None. FINDINGS: Intrauterine gestational sac: None Yolk sac:  Not Visualized. Embryo:  Not Visualized. Cardiac Activity: Not Visualized. Subchorionic hemorrhage:  None visualized. Maternal uterus/adnexae: The endometrium measures 14 mm in thickness. A right ovarian cyst versus dominant follicle measures 2.1 x 1.6 x 1.9 cm. A rounded structure is seen in the left adnexa measuring 3.2 x 2.5 cm. There is moderate volume free intraperitoneal fluid with debris, concerning for blood. IMPRESSION: No intrauterine gestational sac is identified. A rounded structure is seen in the left adnexa and complex fluid is seen within the pelvis which is concerning for blood products. This constellation of findings is suspicious for a ruptured ectopic pregnancy. These results were called by telephone at the time of interpretation on 08/19/2019 at 6:02 pm to provider ABIGAIL  HARRIS , who verbally acknowledged these results. Electronically Signed   By: Romona Curls M.D.   On: 08/19/2019 18:07   US OB Transvaginal  Result Date: 08/19/2019 CLINICAL DATA:  Pelvic pain and  vaginal bleeding for 3 days. Beta HCG 4,507. Last menstrual period 06/19/2019. EXAM: OBSTETRIC <14 WK [place] AND TRANSVAGINAL OB US TECHNIQUE: Both transabdominal and transvaginal ultrasound examinations were performed for complete evaluation of the gestation as well as the maternal uterus, adnexal regions, and pelvic cul-de-sac. Transvaginal technique was performed to assess early pregnancy. COMPARISON:  None. FINDINGS: Intrauterine gestational sac: None Yolk sac:  Not Visualized. Embryo:  Not Visualized. Cardiac Activity: Not Visualized. Subchorionic hemorrhage:  None visualized. Maternal uterus/adnexae: The endometrium measures 14 mm in thickness. A right ovarian cyst versus dominant follicle measures 2.1 x 1.6 x 1.9 cm. A rounded structure is seen in the left adnexa measuring 3.2 x 2.5 cm. There is moderate volume free intraperitoneal fluid with debris, concerning for blood. IMPRESSION: No intrauterine gestational sac is identified. A rounded structure is seen in the left adnexa and complex fluid is seen within the pelvis which is concerning for blood products. This constellation of findings is suspicious for a ruptured ectopic pregnancy. These results were called by telephone at the time of interpretation on 08/19/2019 at 6:02 pm to provider ABIGAIL HARRIS , who verbally acknowledged these results. Electronically Signed   By: Romona Curls M.D.   On: 08/19/2019 18:07    Assessment/Plan:  Patient desires surgical management with laparoscopic salpingectomy.  The risks of surgery were discussed in detail with the patient including but not limited to: bleeding which may require transfusion or reoperation; infection which may require prolonged hospitalization or re-hospitalization and antibiotic therapy; injury to  bowel, bladder, ureters and major vessels or other surrounding organs; formation of adhesions; need for additional procedures including laparotomy; thromboembolic phenomenon; incisional problems and other postoperative or anesthesia complications.  Patient was told that the likelihood that her condition and symptoms will be treated effectively with this surgical management was very high; the postoperative expectations were also discussed in detail.  Scheryl Darter 08/19/2019, 7:23 PM

## 2019-08-19 NOTE — ED Triage Notes (Signed)
Patient reports that she began having vaginal bleeding and abdominal pain x 3 days. Patient states she is [redacted] weeks pregnant. Patient states she has had 4 + Home pregnancy tests.

## 2019-08-19 NOTE — ED Provider Notes (Signed)
[name] Provider Note   CSN: [id number] Arrival date & time: 08/19/19  1427     History Chief Complaint  Patient presents with  . Vaginal Bleeding  . Abdominal Pain  . [redacted] weeks pregnant    Tracy Cooper is a 29 y.o. female who presents emergency department with chief complaint of vaginal bleeding.  Patient states that she has taken several home pregnancy tests that have been positive.  Her last menstrual period was 06/19/2019.  She has 3 previous pregnancies.  Patient states that she began having spotting 3 days ago, yesterday her bleeding increased and overnight her pain became severe.  She states that it was so bad that she vomited.  She has not been passing clots but she has had a lot of bleeding.   Pain is worse with any movement or jarring. She is very tearful.  She denies discharge or urinary symptoms.  HPI     History reviewed. No pertinent past medical history.  There are no problems to display for this patient.   Past Surgical History:  Procedure Laterality Date  . CESAREAN SECTION     x 3     OB History   No obstetric history on file.     Family History  Problem Relation Age of Onset  . COPD Mother     Social History   Tobacco Use  . Smoking status: Current Every Day Smoker  . Smokeless tobacco: Never Used  Vaping Use  . Vaping Use: Every day  . Substances: Nicotine, Flavoring  Substance Use Topics  . Alcohol use: Never  . Drug use: Never    Home Medications Prior to Admission medications   Not on File    Allergies    Patient has no known allergies.  Review of Systems   Review of Systems Ten systems reviewed and are negative for acute change, except as noted in the HPI.   Physical Exam Updated Vital Signs BP 137/79 (BP Location: Left Arm)   Pulse 73   Temp (!) 97.5 F (36.4 C) (Oral)   Resp 18   Ht 5\' 2"  (1.575 m) Comment: Simultaneous filing. User may not have seen previous data.  Wt 78.9  kg Comment: Simultaneous filing. User may not have seen previous data.  LMP 06/19/2019   SpO2 100%   BMI 31.83 kg/m   Physical Exam Vitals and nursing note reviewed. Exam conducted with a chaperone present.  Constitutional:      General: She is not in acute distress.    Appearance: She is well-developed. She is not diaphoretic.  HENT:     Head: Normocephalic and atraumatic.  Eyes:     General: No scleral icterus.    Conjunctiva/sclera: Conjunctivae normal.  Cardiovascular:     Rate and Rhythm: Normal rate and regular rhythm.     Heart sounds: Normal heart sounds. No murmur heard.  No friction rub. No gallop.   Pulmonary:     Effort: Pulmonary effort is normal. No respiratory distress.     Breath sounds: Normal breath sounds.  Abdominal:     General: Bowel sounds are normal. There is no distension.     Palpations: Abdomen is soft. There is no mass.     Tenderness: There is abdominal tenderness in the right lower quadrant, suprapubic area and left lower quadrant. There is guarding and rebound.  Genitourinary:    General: Normal vulva.     Exam position: Lithotomy position.  Vagina: Normal.     Cervix: Cervical bleeding present. No cervical motion tenderness.     Uterus: Tender.      Adnexa:        Right: Tenderness present.        Left: Tenderness present.   Musculoskeletal:     Cervical back: Normal range of motion.  Skin:    General: Skin is warm and dry.  Neurological:     Mental Status: She is alert and oriented to person, place, and time.  Psychiatric:        Behavior: Behavior normal.     ED Results / Procedures / Treatments   Labs (all labs ordered are listed, but only abnormal results are displayed) Labs Reviewed  WET PREP, GENITAL - Abnormal; Notable for the following components:      Result Value   Yeast Wet Prep HPF POC PRESENT (*)    WBC, Wet Prep HPF POC FEW (*)    All other components within normal limits  COMPREHENSIVE METABOLIC PANEL -  Abnormal; Notable for the following components:   Total Protein 8.5 (*)    All other components within normal limits  CBC - Abnormal; Notable for the following components:   Hemoglobin 11.8 (*)    RDW 15.6 (*)    All other components within normal limits  URINALYSIS, ROUTINE W REFLEX MICROSCOPIC - Abnormal; Notable for the following components:   Specific Gravity, Urine 1.004 (*)    Hgb urine dipstick LARGE (*)    Bacteria, UA RARE (*)    All other components within normal limits  HCG, QUANTITATIVE, PREGNANCY - Abnormal; Notable for the following components:   hCG, Beta Chain, Quant, S 4,507 (*)    All other components within normal limits  I-STAT BETA HCG BLOOD, ED ([facility], [facility], [facility] ONLY) - Abnormal; Notable for the following components:   I-stat hCG, quantitative >2,000.0 (*)    All other components within normal limits  SARS CORONAVIRUS 2 BY RT PCR (HOSPITAL ORDER, PERFORMED IN [facility])  LIPASE, BLOOD  GC/CHLAMYDIA PROBE AMP ([place]) NOT AT [facility]    EKG None  Radiology US OB Comp < 14 Wks  Result Date: 08/19/2019 CLINICAL DATA:  Pelvic pain and vaginal bleeding for 3 days. Beta HCG 4,507. Last menstrual period 06/19/2019. EXAM: OBSTETRIC <14 WK [place] AND TRANSVAGINAL OB US TECHNIQUE: Both transabdominal and transvaginal ultrasound examinations were performed for complete evaluation of the gestation as well as the maternal uterus, adnexal regions, and pelvic cul-de-sac. Transvaginal technique was performed to assess early pregnancy. COMPARISON:  None. FINDINGS: Intrauterine gestational sac: None Yolk sac:  Not Visualized. Embryo:  Not Visualized. Cardiac Activity: Not Visualized. Subchorionic hemorrhage:  None visualized. Maternal uterus/adnexae: The endometrium measures 14 mm in thickness. A right ovarian cyst versus dominant follicle measures 2.1 x 1.6 x 1.9 cm. A rounded structure is seen in the left adnexa measuring 3.2 x 2.5 cm. There is moderate volume free  intraperitoneal fluid with debris, concerning for blood. IMPRESSION: No intrauterine gestational sac is identified. A rounded structure is seen in the left adnexa and complex fluid is seen within the pelvis which is concerning for blood products. This constellation of findings is suspicious for a ruptured ectopic pregnancy. These results were called by telephone at the time of interpretation on 08/19/2019 at 6:02 pm to provider Chandra Asher , who verbally acknowledged these results. Electronically Signed   By: Romona Curls M.D.   On: 08/19/2019 18:07   US OB  Transvaginal  Result Date: 08/19/2019 CLINICAL DATA:  Pelvic pain and vaginal bleeding for 3 days. Beta HCG 4,507. Last menstrual period 06/19/2019. EXAM: OBSTETRIC <14 WK [place] AND TRANSVAGINAL OB US TECHNIQUE: Both transabdominal and transvaginal ultrasound examinations were performed for complete evaluation of the gestation as well as the maternal uterus, adnexal regions, and pelvic cul-de-sac. Transvaginal technique was performed to assess early pregnancy. COMPARISON:  None. FINDINGS: Intrauterine gestational sac: None Yolk sac:  Not Visualized. Embryo:  Not Visualized. Cardiac Activity: Not Visualized. Subchorionic hemorrhage:  None visualized. Maternal uterus/adnexae: The endometrium measures 14 mm in thickness. A right ovarian cyst versus dominant follicle measures 2.1 x 1.6 x 1.9 cm. A rounded structure is seen in the left adnexa measuring 3.2 x 2.5 cm. There is moderate volume free intraperitoneal fluid with debris, concerning for blood. IMPRESSION: No intrauterine gestational sac is identified. A rounded structure is seen in the left adnexa and complex fluid is seen within the pelvis which is concerning for blood products. This constellation of findings is suspicious for a ruptured ectopic pregnancy. These results were called by telephone at the time of interpretation on 08/19/2019 at 6:02 pm to provider Sadler Teschner , who verbally acknowledged these  results. Electronically Signed   By: Romona Curls M.D.   On: 08/19/2019 18:07    Procedures .Critical Care Performed by: Arthor Captain, PA-C Authorized by: Arthor Captain, PA-C   Critical care provider statement:    Critical care time (minutes):  60   Critical care time was exclusive of:  Separately billable procedures and treating other patients   Critical care was necessary to treat or prevent imminent or life-threatening deterioration of the following conditions: Ruptured ectopic pregnancy.   Critical care was time spent personally by me on the following activities:  Discussions with consultants, evaluation of patient's response to treatment, examination of patient, ordering and performing treatments and interventions, ordering and review of laboratory studies, ordering and review of radiographic studies, pulse oximetry, re-evaluation of patient's condition, obtaining history from patient or surrogate and review of old charts   (including critical care time)  Medications Ordered in ED Medications  sodium chloride flush (NS) 0.9 % injection 3 mL ( Intravenous MAR Hold 08/19/19 1941)  fentaNYL (SUBLIMAZE) injection 50 mcg ( Intravenous MAR Hold 08/19/19 1941)    Or  ondansetron (ZOFRAN) injection 4 mg ( Intravenous MAR Hold 08/19/19 1941)    ED Course  I have reviewed the triage vital signs and the nursing notes.  Pertinent labs & imaging results that were available during my care of the patient were reviewed by me and considered in my medical decision making (see chart for details).  Clinical Course as of Aug 18 1949  Mon Aug 19, 2019  1816 Discussed the case with Dr. De Nurse of radiology who called emergently to report findings of left-sided ruptured ectopic pregnancy. Case discussed with Dr. Debroah Loop of [facility] practice who is excepted the patient in emergent transfer.   [AH]    Clinical Course User Index [AH] Arthor Captain, PA-C   MDM Rules/Calculators/A&P                           UJ:WJXBJY pain and vaginal bleeding in pregnancy VS: BP 137/79 (BP Location: Left Arm)   Pulse 73   Temp (!) 97.5 F (36.4 C) (Oral)   Resp 18   Ht 5\' 2"  (1.575 m) Comment: Simultaneous filing. User may not have seen previous data.  Wt 78.9 kg Comment: Simultaneous filing. User may not have seen previous data.  LMP 06/19/2019   SpO2 100%   BMI 31.83 kg/m   ZO:[id number]:History is gathered by patient  and triage note. Previous records obtained and reviewed. DDX:The patient's complaint of Pelvic pain involves an extensive number of diagnostic and treatment options, and is a complaint that carries with it a high risk of complications, morbidity, and potential mortality. Given the large differential diagnosis, medical decision making is of high complexity. The differential diagnosis for vaginal bleeding in pregnancy less than 20 weeks includes but is not limited to the following: Ectopic pregnancy, Subchorionic hematoma, First Trimester Abortion, Gestational trophoblastic disease, Heterotopic pregnancy, Implantation bleeding, Molar pregnancy, Cervicitis, Fibroids, Vaginal Trauma  Labs: I ordered reviewed and interpreted labs which include wet prep which is positive for yeast and white cells.  Patient's beta hCG at 4500 which is low for her estimated 8-week pregnancy.  CMP without significant abnormality.  CBC with hemoglobin of 11.8 and insignificant.  Urine without evidence of infection. Imaging: I ordered and reviewed images which included pelvic and transvaginal ultrasound. I independently visualized and interpreted all imaging. Significant findings include apparent ruptured ectopic.  EKG: N/A Consults:Dr. Debroah LoopArnold with OB gyn MDM: Patient with ruptured ectopic pregnancy.  Discussed the case with Dr. Debroah LoopArnold who will accept the patient.  She is stable for emergent transfer to [facility].  Answered all the patient's questions to the best of my ability. Patient  disposition:Emergent Transfer        Final Clinical Impression(s) / ED Diagnoses Final diagnoses:  Ruptured ectopic pregnancy    Rx / DC Orders ED Discharge Orders    None       Arthor CaptainHarris, Barb Shear, PA-C 08/19/19 1955    Arby BarrettePfeiffer, Marcy, MD 08/19/19 2003

## 2019-08-19 NOTE — Op Note (Signed)
Tracy Cooper PROCEDURE DATE: 08/19/2019  PREOPERATIVE DIAGNOSIS: Ruptured ectopic pregnancy POSTOPERATIVE DIAGNOSIS: Ruptured left fallopian tube ectopic pregnancy PROCEDURE: Laparoscopic left salpingectomy and removal of ectopic pregnancy SURGEON:  Adam Phenix, MD ANESTHESIOLOGIST: Achille Rich, MD Anesthesiologist: Achille Rich, MD CRNA: Tillman Abide, CRNA  INDICATIONS: 29 y.o. [phone] here with the preoperative diagnoses as listed above.  Please refer to preoperative notes for more details. Patient was counseled regarding need for laparoscopic salpingectomy. Risks of surgery including bleeding which may require transfusion or reoperation, infection, injury to bowel or other surrounding organs, need for additional procedures including laparotomy and other postoperative/anesthesia complications were explained to patient.  Written informed consent was obtained.  FINDINGS:  moderate amount of hemoperitoneum estimated to be about 75 ml of blood and clots.  Dilated left fallopian tube containing ectopic gestation. Small normal appearing uterus, normal right fallopian tube, right ovary and left ovary.  ANESTHESIA: General INTRAVENOUS FLUIDS: 600 ml ESTIMATED BLOOD LOSS: 75 ml URINE OUTPUT: 300 ml SPECIMENS: Left fallopian tube containing ectopic gestation COMPLICATIONS: None immediate  PROCEDURE IN DETAIL:  The patient was taken to the operating room where general anesthesia was administered and was found to be adequate.  She was placed in the dorsal lithotomy position, and was prepped and draped in a sterile manner.  A Foley catheter was inserted into her bladder and attached to constant drainage and a uterine manipulator was then advanced into the uterus .    After an adequate timeout was performed, attention was turned to the abdomen where an umbilical incision was made with the scalpel. Veress needle was placed to insufflate.The Optiview 11-mm trocar and sleeve were then  advanced without difficulty with the laparoscope under direct visualization into the abdomen.  A survey of the patient's pelvis and abdomen revealed the findings above.   5-mm left and right lower quadrant ports were then placed under direct visualization.  The Nezhat suction irrigator was then used to suction the hemoperitoneum and irrigate the pelvis.  Attention was then turned to the left fallopian tube which was grasped and ligated from the underlying mesosalpinx and uterine attachment using the Harmonic instrument. A few adhesions were seen and lysed.  Good hemostasis was noted.  The specimen was placed in an EndoCatch bag and removed from the abdomen intact.  The abdomen was desufflated, and all instruments were removed.  The fascial incision of the 10-mm site was reapproximated with a 0 Vicryl figure-of-eight stitch; and all skin incisions were closed with Dermabond. The patient tolerated the procedure well.  All instruments, needles, and sponge counts were correct x 2. The patient was taken to the recovery room in stable condition.   The patient will be discharged to home as per PACU criteria.  Routine postoperative instructions given.  She was prescribed Percocet, and Ibuprofen.  She will follow up in the clinic in about 2-3 weeks for postoperative evaluation.   Adam Phenix, MD 08/19/2019 9:05 PM

## 2019-08-19 NOTE — Anesthesia Preprocedure Evaluation (Signed)
Anesthesia Evaluation  Patient identified by MRN, date of birth, ID band Patient awake    Reviewed: Allergy & Precautions, H&P , NPO status , Patient's Chart, lab work & pertinent test results  Airway Mallampati: II   Neck ROM: full    Dental   Pulmonary Current Smoker,    breath sounds clear to auscultation       Cardiovascular negative cardio ROS   Rhythm:regular Rate:Normal     Neuro/Psych    GI/Hepatic   Endo/Other    Renal/GU      Musculoskeletal   Abdominal   Peds  Hematology   Anesthesia Other Findings   Reproductive/Obstetrics Ectopic pregnancy                             Anesthesia Physical Anesthesia Plan  ASA: II and emergent  Anesthesia Plan: General   Post-op Pain Management:    Induction: Intravenous and Rapid sequence  PONV Risk Score and Plan: 2 and Ondansetron, Dexamethasone, Midazolam and Treatment may vary due to age or medical condition  Airway Management Planned: Oral ETT  Additional Equipment:   Intra-op Plan:   Post-operative Plan: Extubation in OR  Informed Consent: I have reviewed the patients History and Physical, chart, labs and discussed the procedure including the risks, benefits and alternatives for the proposed anesthesia with the patient or authorized representative who has indicated his/her understanding and acceptance.       Plan Discussed with: CRNA, Anesthesiologist and Surgeon  Anesthesia Plan Comments:         Anesthesia Quick Evaluation

## 2019-08-20 ENCOUNTER — Encounter (HOSPITAL_COMMUNITY): Payer: Self-pay | Admitting: Obstetrics & Gynecology

## 2019-08-20 LAB — GC/CHLAMYDIA PROBE AMP (~~LOC~~) NOT AT ARMC
Chlamydia: NEGATIVE
Comment: NEGATIVE
Comment: NORMAL
Neisseria Gonorrhea: NEGATIVE

## 2019-08-21 LAB — SURGICAL PATHOLOGY

## 2019-08-21 NOTE — Anesthesia Postprocedure Evaluation (Signed)
Anesthesia Post Note  Patient: Tracy Cooper  Procedure(s) Performed: DIAGNOSTIC LAPAROSCOPY WITH Left Salpingectomy and  REMOVAL OF ECTOPIC PREGNANCY (N/A Abdomen)     Patient location during evaluation: PACU Anesthesia Type: General Level of consciousness: awake and alert Pain management: pain level controlled Vital Signs Assessment: post-procedure vital signs reviewed and stable Respiratory status: spontaneous breathing, nonlabored ventilation, respiratory function stable and patient connected to nasal cannula oxygen Cardiovascular status: blood pressure returned to baseline and stable Postop Assessment: no apparent nausea or vomiting Anesthetic complications: no   No complications documented.  Last Vitals:  Vitals:   08/19/19 2215 08/19/19 2245  BP: 106/64 108/64  Pulse: 74 75  Resp: 20 20  Temp:  36.9 C  SpO2: 100% 100%    Last Pain:  Vitals:   08/19/19 2130  TempSrc:   PainSc: Asleep                 Kaneesha Constantino S

## 2019-09-04 ENCOUNTER — Telehealth (INDEPENDENT_AMBULATORY_CARE_PROVIDER_SITE_OTHER): Payer: Medicaid Other

## 2019-09-04 DIAGNOSIS — Z9889 Other specified postprocedural states: Secondary | ICD-10-CM

## 2019-09-04 NOTE — Telephone Encounter (Signed)
Pt called requesting to set up an appt.  Per chart review, pt has an appt already scheduled for 09/12/19 @ 1015.  Called pt and informed her that she already has an appt scheduled.  Appt time provided to pt.  Pt verbalized understanding with no further questions or concerns.    Addison Naegeli, RN  09/04/19

## 2019-09-12 ENCOUNTER — Other Ambulatory Visit (HOSPITAL_COMMUNITY)
Admission: RE | Admit: 2019-09-12 | Discharge: 2019-09-12 | Disposition: A | Discharge status: Home / Self Care | Payer: Medicaid Other | Source: Ambulatory Visit | Attending: Obstetrics & Gynecology | Admitting: Obstetrics & Gynecology

## 2019-09-12 ENCOUNTER — Other Ambulatory Visit: Payer: Self-pay

## 2019-09-12 ENCOUNTER — Ambulatory Visit (INDEPENDENT_AMBULATORY_CARE_PROVIDER_SITE_OTHER): Payer: Medicaid Other | Admitting: Obstetrics & Gynecology

## 2019-09-12 VITALS — BP 112/75 | HR 76 | Ht 62.0 in | Wt 170.0 lb

## 2019-09-12 DIAGNOSIS — Z302 Encounter for sterilization: Secondary | ICD-10-CM

## 2019-09-12 DIAGNOSIS — O00102 Left tubal pregnancy without intrauterine pregnancy: Secondary | ICD-10-CM

## 2019-09-12 DIAGNOSIS — Z01419 Encounter for gynecological examination (general) (routine) without abnormal findings: Secondary | ICD-10-CM | POA: Insufficient documentation

## 2019-09-12 DIAGNOSIS — Z9889 Other specified postprocedural states: Secondary | ICD-10-CM

## 2019-09-12 DIAGNOSIS — K661 Hemoperitoneum: Secondary | ICD-10-CM | POA: Insufficient documentation

## 2019-09-12 MED ORDER — MEDROXYPROGESTERONE ACETATE 150 MG/ML IM SUSP
150.0000 mg | Freq: Once | INTRAMUSCULAR | Status: AC
  Administered 2019-09-12: 150 mg via INTRAMUSCULAR

## 2019-09-12 NOTE — Progress Notes (Signed)
Subjective:wants Depo Provera and to schedule tubal ligation     Tracy Cooper is a 29 y.o. female who presents to the clinic 4 weeks status post laparoscopy and   for left ectopic pregnancy. Eating a regular diet without difficulty. Bowel movements are normal. The patient is not having any pain.  The following portions of the patient's history were reviewed and updated as appropriate: allergies, current medications, past family history, past medical history, past social history, past surgical history and problem list.  Review of Systems Pertinent items are noted in HPI.    Objective:    BP 112/75   Pulse 76   Ht 5\' 2"  (1.575 m)   Wt 170 lb (77.1 kg)   BMI 31.09 kg/m  General:  alert, cooperative and no distress  Abdomen: soft, bowel sounds active, non-tender  Incision:   healing well, no drainage, no erythema, no hernia, no seroma, no swelling, no dehiscence, incision well approximated     Assessment:    Doing well postoperatively. Operative findings again reviewed. Pathology report discussed.    Plan:    1. Continue any current medications. 2. Wound care discussed. 3. Activity restrictions: none 4. Anticipated return to work: now. 5. Follow up: a few weeks for tubal ligation 6.Sign BTL papers. 29 y.o. . with undesired fertility desires permanent sterilization. Risks and benefits of laparoscopic tubal sterilization procedure was discussed with the patient including permanence of method, bleeding, infection, injury to surrounding organs, anesthesia and need for additional procedures. Risk failure of 0.5-1% with increased risk of ectopic gestation if pregnancy occurs was also discussed with patient. Patient verbalized understanding and all questions were answered.  26 Tracy Paris MD 09/12/2019     Patient ID: 09/14/2019, female   DOB: 20-Feb-1990, 29 y.o.   MRN: [id number]

## 2019-09-12 NOTE — Progress Notes (Signed)
Patient has elevated gad7- will come back to see Asher Muir

## 2019-09-12 NOTE — Progress Notes (Signed)
Patient ID: Tracy Cooper, female   DOB: 12-09-1990, 29 y.o.   MRN: [id number]

## 2019-09-12 NOTE — Patient Instructions (Signed)

## 2019-09-13 ENCOUNTER — Encounter: Payer: Self-pay | Admitting: *Deleted

## 2019-09-13 LAB — CYTOLOGY - PAP: Diagnosis: NEGATIVE

## 2019-09-16 ENCOUNTER — Ambulatory Visit (INDEPENDENT_AMBULATORY_CARE_PROVIDER_SITE_OTHER): Payer: Medicaid Other | Admitting: Clinical

## 2019-09-16 ENCOUNTER — Other Ambulatory Visit: Payer: Self-pay

## 2019-09-16 DIAGNOSIS — F419 Anxiety disorder, unspecified: Secondary | ICD-10-CM | POA: Diagnosis not present

## 2019-09-16 NOTE — BH Specialist Note (Signed)
Integrated Behavioral Health via Telemedicine Video (Caregility) Visit  09/16/2019 Tracy Cooper [id number]  Number of Integrated Behavioral Health visits: 1 Session Start time: 2:22   Session End time: 3:16 Total time: 71  Referring Provider: Scheryl Darter, MD Type of Visit: Video Patient/Family location: Home  [facility] Provider location: [facility]  All persons participating in visit: Patient Tracy Cooper and [facility] Cadey Bazile    Confirmed patient's address: Yes  Confirmed patient's phone number: Yes  Any changes to demographics: No   Confirmed patient's insurance: Yes  Any changes to patient's insurance: No   Discussed confidentiality: Yes   I connected with Tracy Cooper by a video enabled telemedicine application (Caregility) and verified that I am speaking with the correct person using two identifiers.     I discussed the limitations of evaluation and management by telemedicine and the availability of in person appointments.  I discussed that the purpose of this visit is to provide behavioral health care while limiting exposure to the novel coronavirus.   Discussed there is a possibility of technology failure and discussed alternative modes of communication if that failure occurs.  I discussed that engaging in this virtual visit, they consent to the provision of behavioral healthcare and the services will be billed under their insurance.  Patient and/or legal guardian expressed understanding and consented to virtual visit: Yes   PRESENTING CONCERNS: Patient and/or family reports the following symptoms/concerns: Pt states her primary concern today is  panic that occurs when driving, going into public spaces, and being alone, along with an increase in anxiety with change in routine or disorder in household; pt has been taking Celexa 40mg  since 29yo and is no longer working to manage symptoms. Pt uses many  self-coping strategies throughout the years that have been helpful. Pt prefers no referral to psychiatry.  Duration of problem: Ongoing; Severity of problem: moderate  STRENGTHS (Protective Factors/Coping Skills): Utilizes daily self-coping strategies (self-talk, humor, reframing, grounding); adhering to treatment via BH medication  GOALS ADDRESSED: Patient will: 1.  Reduce symptoms of: anxiety, compulsions and stress  2.  Demonstrate ability to: Increase healthy adjustment to current life circumstances  INTERVENTIONS: Interventions utilized:  Medication Monitoring and Psychoeducation and/or Health Education Standardized Assessments completed: GAD-7 and PHQ 9  ASSESSMENT: Patient currently experiencing Anxiety disorder, unspecified.   Patient may benefit from psychoeducation and brief therapeutic interventions regarding coping with symptoms of anxiety with panic attack .  PLAN: 1. Follow up with behavioral health clinician on : One week 2. Behavioral recommendations:  -Continue taking BH medication as prescribed; you will be contacted about any recommended changes in medication -Continue using daily self-coping strategies that are helping to manage symptoms 3. Referral(s): Integrated (In Clinic)  I discussed the assessment and treatment plan with the patient and/or parent/guardian. They were provided an opportunity to ask questions and all were answered. They agreed with the plan and demonstrated an understanding of the instructions.   They were advised to call back or seek an in-person evaluation if the symptoms worsen or if the condition fails to improve as anticipated.  [employer]  Depression screen [facility] 2/9 09/12/2019  Decreased Interest 0  Down, Depressed, Hopeless 0  PHQ - 2 Score 0  Altered sleeping 1  Tired, decreased energy 1  Change in appetite 0  Feeling bad or failure about yourself  0  Trouble concentrating 0  Moving slowly or  fidgety/restless 0  Suicidal thoughts 0  PHQ-9 Score 2   GAD 7 : Generalized Anxiety Score 09/12/2019  Nervous, Anxious, on Edge 2  Control/stop worrying 2  Worry too much - different things 2  Trouble relaxing 1  Restless 1  Easily annoyed or irritable 1  Afraid - awful might happen 1  Total GAD 7 Score 10

## 2019-09-16 NOTE — Patient Instructions (Signed)
[facility]  [facility] [address] [place], [place] [address] [phone] (main office) [phone] (Blayne Frankie's office)  Coping with Panic Attacks   What is a panic attack?  You may have had a panic attack if you experienced four or more of the symptoms listed below coming on abruptly and peaking in about 10 minutes.  Panic Symptoms   . Pounding heart  . Sweating  . Trembling or shaking  . Shortness of breath  . Feeling of choking  . Chest pain  . Nausea or abdominal distress    . Feeling dizzy, unsteady, lightheaded, or faint  . Feelings of unreality or being detached from yourself  . Fear of losing control or going crazy  . Fear of dying  . Numbness or tingling  . Chills or hot flashes      Panic attacks are sometimes accompanied by avoidance of certain places or situations. These are often situations that would be difficult to escape from or in which help might not be available. Examples might include crowded shopping malls, public transportation, restaurants, or driving.   Why do panic attacks occur?   Panic attacks are the body's alarm system gone awry. All of us have a built-in alarm system, powered by adrenaline, which increases our heart rate, breathing, and blood flow in response to danger. Ordinarily, this 'danger response system' works well. In some people, however, the response is either out of proportion to whatever stress is going on, or may come out of the blue without any stress at all.   For example, if you are walking in the woods and see a bear coming your way, a variety of changes occur in your body to prepare you to either fight the danger or flee from the situation. Your heart rate will increase to get more blood flow around your body, your breathing rate will quicken so that more oxygen is available, and your muscles will tighten in order to be ready to fight or run. You may feel nauseated as blood flow leaves your  stomach area and moves into your limbs. These bodily changes are all essential to helping you survive the dangerous situation. After the danger has passed, your body functions will begin to go back to normal. This is because your body also has a system for "recovering" by bringing your body back down to a normal state when the danger is over.   As you can see, the emergency response system is adaptive when there is, in fact, a "true" or "real" danger (e.g., bear). However, sometimes people find that their emergency response system is triggered in "everyday" situations where there really is no true physical danger (e.g., in a meeting, in the grocery store, while driving in normal traffic, etc.).   What triggers a panic attack?  Sometimes particularly stressful situations can trigger a panic attack. For example, an argument with your spouse or stressors at work can cause a stress response (activating the emergency response system) because you perceive it as threatening or overwhelming, even if there is no direct risk to your survival.  Sometimes panic attacks don't seem to be triggered by anything in particular- they may "come out of the blue". Somehow, the natural "fight or flight" emergency response system has gotten activated when there is no real danger. Why does the body go into "emergency mode" when there is no real danger?   Often, people with panic attacks are frightened or alarmed by the physical sensations of   the emergency response system. First, unexpected physical sensations are experienced (tightness in your chest or some shortness of breath). This then leads to feeling fearful or alarmed by these symptoms ("Something's wrong!", "Am I having a heart attack?", "Am I going to faint?") The mind perceives that there is a danger even though no real danger exists. This, in turn, activates the emergency response system ("fight or flight"), leading to a "full blown" panic attack. In summary, panic  attacks occur when we misinterpret physical symptoms as signs of impending death, craziness, loss of control, embarrassment, or fear of fear. Sometimes you may be aware of thoughts of danger that activate the emergency response system (for example, thinking "I'm having a heart attack" when you feel chest pressure or increased heart rate). At other times, however, you may not be aware of such thoughts. After several incidences of being afraid of physical sensations, anxiety and panic can occur in response to the initial sensations without conscious thoughts of danger. Instead, you just feel afraid or alarmed. In other words, the panic or fear may seem to occur "automatically" without you consciously telling yourself anything.   After having had one or more panic attacks, you may also become more focused on what is going on inside your body. You may scan your body and be more vigilant about noticing any symptoms that might signal the start of a panic attack. This makes it easier for panic attacks to happen again because you pick up on sensations you might otherwise not have noticed, and misinterpret them as something dangerous. A panic attack may then result.      How do I cope with panic attacks?  An important part of overcoming panic attacks involves re-interpreting your body's physical reactions and teaching yourself ways to decrease the physical arousal. This can be done through practicing the cognitive and behavioral interventions below.   Research has found that over half of people who have panic attacks show some signs of hyperventilation or overbreathing. This can produce initial sensations that alarm you and lead to a panic attack. Overbreathing can also develop as part of the panic attack and make the symptoms worse. When people hyperventilate, certain blood vessels in the body become narrower. In particular, the brain may get slightly less oxygen. This can lead to the symptoms of dizziness,  confusion, and lightheadedness that often occur during panic attacks. Other parts of the body may also get a bit less oxygen, which may lead to numbness or tingling in the hands or feet or the sensation of cold, clammy hands. It also may lead the heart to pump harder. Although these symptoms may be frightening and feel unpleasant, it is important to remember that hyperventilating is not dangerous. However, you can help overcome the unpleasantness of overbreathing by practicing Breathing Retraining.   Practice this basic technique three times a day, every day:  . Inhale. With your shoulders relaxed, inhale as slowly and deeply as you can while you count to six. If you can, use your diaphragm to fill your lungs with air.  . Hold. Keep the air in your lungs as you slowly count to four.  . Exhale. Slowly breath out as you count to six.  . Repeat. Do the inhale-hold-exhale cycle several times. Each time you do it, exhale for longer counts.  Like any new skill, Breathing Retraining requires practice. Try practicing this skill twice a day for several minutes. Initially, do not try this technique in specific situations or when you   become frightened or have a panic attack. Begin by practicing in a quiet environment to build up your skill level so that you can later use it in time of "emergency."   2. Decreasing Avoidance  Regardless of whether you can identify why you began having panic attacks or whether they seemed to come out of the blue, the places where you began having panic attacks often can become triggers themselves. It is not uncommon for individuals to begin to avoid the places where they have had panic attacks. Over time, the individual may begin to avoid more and more places, thereby decreasing their activities and often negatively impacting their quality of life. To break the cycle of avoidance, it is important to first identify the places or situations that are being avoided, and then to do some  "relearning."  To begin this intervention, first create a list of locations or situations that you tend to avoid. Then choose an avoided location or situation that you would like to target first. Now develop an "exposure hierarchy" for this situation or location. An "exposure hierarchy" is a list of actions that make you feel anxious in this situation. Order these actions from least to most anxiety-producing. It is often helpful to have the first item on your hierarchy involve thinking or imagining part of the feared/avoided situation.   Here is an example of an exposure hierarchy for decreasing avoidance of the grocery store. Note how it is ordered from the least amount of anxiety (at the top) to the most anxiety (at the bottom):  . Think about going to the grocery store alone.  . Go to the grocery store with a friend or family member.  . Go to the grocery store alone to pick up a few small items (5-10 minutes in the store).  . Shopping for 10-20 minutes in the store alone.  . Doing the shopping for the week by myself (20-30 minutes in the store).   Your homework is to "expose" yourself to the lowest item on your hierarchy and use your breathing relaxation and coping statements (see below) to help you remain in the situation. Practice this several times during the upcoming week. Once you have mastered each item with minimal anxiety, move on to the next higher action on your list.   Cognitive Interventions  1. Identify your negative self-talk Anxious thoughts can increase anxiety symptoms and panic. The first step in changing anxious thinking is to identify your own negative, alarming self-talk. Some common alarming thoughts:  . I'm having a heart attack.            . I must be going crazy. . I think I'm dying. . People will think I'm crazy. . I'm going to pass our.  . Oh no- here it comes.  . I can't stand this.  . I've got to get out of here!  2. Use positive coping statements Changing or  disrupting a pattern of anxious thoughts by replacing them with more calming or supportive statements can help to divert a panic attack. Some common helpful coping statements:  . This is not an emergency.  . I don't like feeling this way, but I can accept it.  . I can feel like this and still be okay.  . This has happened before, and I was okay. I'll be okay this time, too.  . I can be anxious and still deal with this situation.    /Emotional Wellbeing Apps and Websites Here are a few   free apps meant to help you to help yourself.  To find, try searching on the internet to see if the app is offered on Apple/Android devices. If your first choice doesn't come up on your device, the good news is that there are many choices! Play around with different apps to see which ones are helpful to you.    Calm This is an app meant to help increase calm feelings. Includes info, strategies, and tools for tracking your feelings.      Calm Harm  This app is meant to help with self-harm. Provides many 5-minute or 15-min coping strategies for doing instead of hurting yourself.       Healthy Minds Health Minds is a problem-solving tool to help deal with emotions and cope with stress you encounter wherever you are.      MindShift This app can help people cope with anxiety. Rather than trying to avoid anxiety, you can make an important shift and face it.      MY3  MY3 features a support system, safety plan and resources with the goal of offering a tool to use in a time of need.       My Life My Voice  This mood journal offers a simple solution for tracking your thoughts, feelings and moods. Animated emoticons can help identify your mood.       Relax Melodies Designed to help with sleep, on this app you can mix sounds and meditations for relaxation.      Smiling Mind Smiling Mind is meditation made easy: it's a simple tool that helps put a smile on your mind.        Stop, Breathe &  Think  A friendly, simple guide for people through meditations for mindfulness and compassion.  Stop, Breathe and Think Kids Enter your current feelings and choose a "mission" to help you cope. Offers videos for certain moods instead of just sound recordings.       Team Orange The goal of this tool is to help teens change how they think, act, and react. This app helps you focus on your own good feelings and experiences.      The Virtual Hope Box The Virtual Hope Box (VHB) contains simple tools to help patients with coping, relaxation, distraction, and positive thinking.     

## 2019-09-19 ENCOUNTER — Other Ambulatory Visit: Payer: Self-pay | Admitting: Obstetrics & Gynecology

## 2019-09-23 NOTE — Progress Notes (Signed)
  [facility] Health Initiative (vBHI) Psychiatric Consultant Case Review  Tracy Cooper is a 29 y.o. year old female with a history of anxiety. She has had worsening in anxiety and panic attacks. Per chart review, she recently underwent laparoscopic left salpingectomy and removal of ectopic pregnancy.  She is scheduled to have tubal litigation. She has been on citalopram on and off since teenager.   Assessment/Provisional Diagnosis # Unspecified anxiety disorder BH specialist to obtain more information about clinical course of anxiety/psychosocial stressors given it is unclear whether worsening in her mood is more situational and/or \\she  would benefit from change in psychotropics. Will recommend hydroxyzine prn at this time.  ? Recommendation - Continue citalopram 40 mg daily.  - Start hydroxyzine 25 mg twice a day as needed for anxiety - BH specialist to obtain more history/past trials of antidepressant  Thank you for your consult. We will continue to follow the patient. Please contact vBHI for any questions or concerns.  The above treatment considerations and suggestions are based on consultation with the [facility] specialist and/or PCP and a review of information available in the shared registry and the patient's Electronic Health Record (EHR). I have not personally examined the patient. All recommendations should be implemented with consideration of the patient's relevant prior history and current clinical status. Please feel free to call me with any questions about the care of this patient.

## 2019-10-10 ENCOUNTER — Telehealth: Payer: Self-pay

## 2019-10-10 ENCOUNTER — Telehealth: Payer: Self-pay | Admitting: Clinical

## 2019-10-10 NOTE — Telephone Encounter (Signed)
Attempt to f/u with pt; phone rang and did not go to voicemail, so unable to leave message.

## 2019-10-10 NOTE — Telephone Encounter (Signed)
Called patient, no answer, left voicemail, surgery time has changed, her surgery will be at 8:30am, please arrive by 7:00am. Advised to call office if she had any questions.

## 2019-10-11 ENCOUNTER — Other Ambulatory Visit (HOSPITAL_COMMUNITY): Payer: Medicaid Other

## 2019-10-14 ENCOUNTER — Telehealth: Payer: Self-pay | Admitting: Clinical

## 2019-10-14 ENCOUNTER — Encounter (HOSPITAL_COMMUNITY): Payer: Self-pay | Admitting: Anesthesiology

## 2019-10-14 NOTE — Telephone Encounter (Signed)
Attempt to f/u with pt; Left HIPPA-compliant message to call back Tanita Palinkas from [facility] at  [phone] (Cabella Kimm's office).   

## 2019-10-14 NOTE — Progress Notes (Addendum)
Multiple attempts made to reach pt and have left message since last week and left message this morning stated that she is scheduled for surgery tomorrow with Dr Debroah Loop , needs a covid test done prior to surgery, and needs pre-op interview w/ instructions.  Informed pt and gave her Dr Debroah Loop OR scheduler phone number if she is not having surgery to please call them. Called and left message with Rhea Bleacher, [place] scheduler for Dr Debroah Loop.

## 2019-10-15 ENCOUNTER — Ambulatory Visit (HOSPITAL_BASED_OUTPATIENT_CLINIC_OR_DEPARTMENT_OTHER)
Admission: RE | Admit: 2019-10-15 | Payer: Medicaid Other | Source: Home / Self Care | Admitting: Obstetrics & Gynecology

## 2019-10-15 SURGERY — LIGATION, FALLOPIAN TUBE, LAPAROSCOPIC
Anesthesia: Choice | Laterality: Bilateral

## 2019-10-23 ENCOUNTER — Encounter (HOSPITAL_COMMUNITY): Payer: Self-pay | Admitting: *Deleted

## 2019-10-23 ENCOUNTER — Emergency Department (HOSPITAL_COMMUNITY)
Admission: EM | Admit: 2019-10-23 | Discharge: 2019-10-23 | Disposition: A | Discharge status: Home / Self Care | Payer: Medicaid Other | Attending: Emergency Medicine | Admitting: Emergency Medicine

## 2019-10-23 ENCOUNTER — Other Ambulatory Visit: Payer: Self-pay

## 2019-10-23 DIAGNOSIS — Z5321 Procedure and treatment not carried out due to patient leaving prior to being seen by health care provider: Secondary | ICD-10-CM | POA: Diagnosis not present

## 2019-10-23 DIAGNOSIS — R110 Nausea: Secondary | ICD-10-CM | POA: Diagnosis not present

## 2019-10-23 DIAGNOSIS — M546 Pain in thoracic spine: Secondary | ICD-10-CM | POA: Insufficient documentation

## 2019-10-23 DIAGNOSIS — R519 Headache, unspecified: Secondary | ICD-10-CM | POA: Diagnosis not present

## 2019-10-23 DIAGNOSIS — R202 Paresthesia of skin: Secondary | ICD-10-CM | POA: Diagnosis not present

## 2019-10-23 HISTORY — DX: Anxiety disorder, unspecified: F41.9

## 2019-10-23 NOTE — ED Triage Notes (Signed)
Headache since Friday, nausea, pain in upper back, neck with tingling in fingers, Normally does not have headaches.

## 2019-11-06 ENCOUNTER — Telehealth: Payer: Self-pay | Admitting: Clinical

## 2019-11-06 NOTE — Telephone Encounter (Signed)
Attempt to f/u with pt; Left HIPPA-compliant message to call back Wille Aubuchon from [facility] [place] [facility] at  [phone] (Evonna Stoltz's office).   

## 2019-11-15 ENCOUNTER — Telehealth: Payer: Self-pay | Admitting: Clinical

## 2019-11-15 NOTE — Telephone Encounter (Signed)
Pt requests to begin Lexapro for anxiety, as Celexa is no longer working to manage her symptoms. Pt is actively making an attempt to establish care with new PCP ([facility]), but has been unable to obtain initial appointment. Pt will f/u with [facility] on Monday.

## 2019-11-18 ENCOUNTER — Ambulatory Visit (INDEPENDENT_AMBULATORY_CARE_PROVIDER_SITE_OTHER): Payer: Medicaid Other | Admitting: Clinical

## 2019-11-18 ENCOUNTER — Other Ambulatory Visit: Payer: Self-pay | Admitting: Obstetrics and Gynecology

## 2019-11-18 DIAGNOSIS — F419 Anxiety disorder, unspecified: Secondary | ICD-10-CM

## 2019-11-18 MED ORDER — ESCITALOPRAM OXALATE 5 MG PO TABS: 5.0000 mg | ORAL_TABLET | Freq: Every day | ORAL | 0 refills | Status: DC

## 2019-11-18 NOTE — BH Specialist Note (Signed)
Integrated Behavioral Health via Telemedicine Video (Caregility) Visit  11/18/2019 Tracy Cooper [id number]  Number of Integrated Behavioral Health visits: 2 Session Start time: 9:15  Session End time: 10:15 Total time: 60 minutes  Referring Provider: Scheryl Darter, MD Type of Service: Individual Patient/Family location: Home [facility] Provider location: [facility]  All persons participating in visit: Patient Tracy Cooper and [facility]     I connected with Tracy Cooper a video enabled telemedicine application (Caregility) and verified that I am speaking with the correct person using two identifiers.   Discussed confidentiality: at previous visit  Confirmed demographics & insurance:  Yes   I discussed that engaging in this virtual visit, they consent to the provision of behavioral healthcare and the services will be billed under their insurance.   Patient and/or legal guardian expressed understanding and consented to virtual visit: Yes   PRESENTING CONCERNS: Patient and/or family reports the following symptoms/concerns: Pt states her primary concern today is ongoing anxiety, inability to focus, feeling panicky, and noticing overwhelming emotions(laughing/crying at the same time); pt is able to manage symptoms best with a new puppy,and is interested in registering him as an emotional support animal.  Duration of problem: Ongoing; Severity of problem: moderately severe  STRENGTHS (Protective Factors/Coping Skills): Adhering to treatment, utilizes daily self-coping strategies(self-talk, humor, reframing, grounding, setting healthy boundaries with family)  ASSESSMENT: Patient currently experiencing Anxiety disorder.    GOALS ADDRESSED: Patient will: 1.  Reduce symptoms of: anxiety  2.  Demonstrate ability to: Increase motivation to adhere to plan of care   Progress of  Goals: Ongoing  INTERVENTIONS: Interventions utilized:  Solution-Focused Strategies Standardized Assessments completed & reviewed: Not giving today   OUTCOME: Patient Response: Pt agrees to treatment plan   PLAN: 1. Follow up with behavioral health clinician on : One month 2. Behavioral recommendations:  -Begin taking Lexapro as prescribed -Accept referral to psychiatry (Talk to psychiatrist about need for emotional support animal) -Continue using self-coping strategies daily 3. Referral(s): Integrated [facility] (In Clinic) and [facility] (LME/Outside Clinic)  I discussed the assessment and treatment plan with the patient and/or parent/guardian. They were provided an opportunity to ask questions and all were answered. They agreed with the plan and demonstrated an understanding of the instructions.   They were advised to call back or seek an in-person evaluation as appropriate.  I discussed that the purpose of this visit is to provide behavioral health care while limiting exposure to the novel coronavirus.  Discussed there is a possibility of technology failure and discussed alternative modes of communication if that failure occurs.  Tracy Cooper Tracy Cooper

## 2019-11-18 NOTE — Patient Instructions (Signed)
[facility] [address] [place], [place] [address] [phone] (main office) [phone] (Lewis Keats's office)  To change Medicaid plan, go to [phone] or [email or web address]

## 2019-12-03 NOTE — BH Specialist Note (Deleted)
Integrated Behavioral Health via Telemedicine Video (Caregility) Visit  12/03/2019 Thurza Kwiecinski [id number]  Number of Integrated Behavioral Health visits: 3 Session Start time: 9:15***  Session End time: 9:45*** Total time: {IBH Total Time:21014050} minutes  Referring Provider: Scheryl Darter, MD Type of Service: Individual, Family, *** Patient/Family location: Home [facility] Provider location: [facility] for [employer] at [facility]  All persons participating in visit: Patient *** and [facility] [place] ***    I connected with Shakedra Beam and/or Preslynn Bier Spillers's {family members:20773} by a video enabled telemedicine application (Caregility) and verified that I am speaking with the correct person using two identifiers.   Discussed confidentiality: {YES/NO:21197}  Confirmed demographics & insurance:  {YES/NO:21197}  I discussed that engaging in this virtual visit, they consent to the provision of behavioral healthcare and the services will be billed under their insurance.   Patient and/or legal guardian expressed understanding and consented to virtual visit: {YES/NO:21197}  PRESENTING CONCERNS: Patient and/or family reports the following symptoms/concerns: *** Duration of problem: ***; Severity of problem: {Mild/Moderate/Severe:20260}  STRENGTHS (Protective Factors/Coping Skills): {CHL AMB BH PROTECTIVE FACTORS/STRENGTHS:7472011027}  ASSESSMENT: Patient currently experiencing ***.    GOALS ADDRESSED: Patient will: 1.  Reduce symptoms of: {IBH Symptoms:21014056}  2.  Increase knowledge and/or ability of: {IBH Patient Tools:21014057}  3.  Demonstrate ability to: {IBH Goals:21014053}   Progress of Goals: {CHL AMB BH PROGRESS TOWARDS VOZDG:6440347425}  INTERVENTIONS: Interventions utilized:  {IBH Interventions:21014054} Standardized Assessments completed & reviewed: {IBH Screening Tools:21014051}   OUTCOME: Patient Response:  ***   PLAN: 1. Follow up with behavioral health clinician on : *** 2. Behavioral recommendations: *** 3. Referral(s): {IBH Referrals:21014055}  I discussed the assessment and treatment plan with the patient and/or parent/guardian. They were provided an opportunity to ask questions and all were answered. They agreed with the plan and demonstrated an understanding of the instructions.   They were advised to call back or seek an in-person evaluation as appropriate.  I discussed that the purpose of this visit is to provide behavioral health care while limiting exposure to the novel coronavirus.  Discussed there is a possibility of technology failure and discussed alternative modes of communication if that failure occurs.  Valetta Close Naraya Stoneberg

## 2019-12-15 ENCOUNTER — Other Ambulatory Visit: Payer: Self-pay | Admitting: Obstetrics and Gynecology

## 2019-12-18 ENCOUNTER — Other Ambulatory Visit: Payer: Self-pay

## 2019-12-18 ENCOUNTER — Ambulatory Visit (HOSPITAL_COMMUNITY): Payer: Medicaid Other | Admitting: Licensed Clinical Social Worker

## 2019-12-18 ENCOUNTER — Telehealth (HOSPITAL_COMMUNITY): Payer: Self-pay | Admitting: Licensed Clinical Social Worker

## 2019-12-18 NOTE — Telephone Encounter (Signed)
LCSW sent link for video session per schedule. Pt signs on. She is at home and providing care/supervision for her [age] yr old son. Pt reports there is no else home who can monitor child during appt. Pt distracted by child and LCSW explains the need for privacy during appt. Assessed for ability to have care for child. Pt states she could arranged for his grandmother to assist. She agrees for appt to be rescheduled and will have grandmother assist with child care.

## 2019-12-23 ENCOUNTER — Ambulatory Visit (INDEPENDENT_AMBULATORY_CARE_PROVIDER_SITE_OTHER): Payer: Medicaid Other | Admitting: Clinical

## 2019-12-23 ENCOUNTER — Other Ambulatory Visit: Payer: Self-pay | Admitting: Obstetrics and Gynecology

## 2019-12-23 DIAGNOSIS — F419 Anxiety disorder, unspecified: Secondary | ICD-10-CM | POA: Diagnosis not present

## 2019-12-23 MED ORDER — ESCITALOPRAM OXALATE 5 MG PO TABS
5.0000 mg | ORAL_TABLET | Freq: Every day | ORAL | 0 refills | Status: DC
Start: 2019-12-23 — End: 2020-01-29

## 2019-12-23 NOTE — Patient Instructions (Signed)
[facility] [address] [place], [place] [address] [phone] (main office) [phone] (Dshaun Reppucci's office)   

## 2019-12-23 NOTE — BH Specialist Note (Signed)
Integrated Behavioral Health via Telemedicine Video (Caregility) Visit  12/23/2019 Tracy Cooper [id number]  Number of Integrated Behavioral Health visits: 3 Session Start time: 1:16  Session End time: 1:56 Total time: 40  minutes  Referring Provider: Scheryl Darter, MD Type of Service: Individual Patient/Family location: Home [facility] Provider location: [facility]  All persons participating in visit: Patient Tracy Cooper and Tracy Cooper     I connected with Tracy Cooper  by a video enabled telemedicine application (Caregility) and verified that I am speaking with the correct person using two identifiers.   Discussed confidentiality: Yes   Confirmed demographics & insurance:  Yes   I discussed that engaging in this virtual visit, they consent to the provision of behavioral healthcare and the services will be billed under their insurance.   Patient and/or legal guardian expressed understanding and consented to virtual visit: Yes   PRESENTING CONCERNS: Patient and/or family reports the following symptoms/concerns: Pt states her primary concern today is being out of Lexapro, after being unable to complete virtual visit with [facility] as therapist unable to do visit with pt's 28yo in room. Pt says that since starting Lexapro, is not having panic attacks, but still having sleep difficulty. Pt says it helps to talk through feelings about self-image today.  Duration of problem: Ongoing; Severity of problem: moderate  STRENGTHS (Protective Factors/Coping Skills): Adhering to treatment, utilizes daily self-coping strategies (self-talk, humor, reframing, grounding, setting healthy boundaries with family)  ASSESSMENT: Patient currently experiencing Anxiety disorder.    GOALS ADDRESSED: Patient will: 1.  Reduce symptoms of: anxiety  2.  Demonstrate ability to: Increase healthy adjustment to current  life circumstances and Increase motivation to adhere to plan of care   Progress of Goals: Ongoing  INTERVENTIONS: Interventions utilized:  Solution-Focused Strategies Standardized Assessments completed & reviewed: Not given today   OUTCOME:  Patient Response: Pt agrees to treatment plan   PLAN: 1. Follow up with behavioral health clinician on : As needed 2. Behavioral recommendations:  -Continue taking Lexapro as prescribed -Continue with plan to talk to reschedule appointment with psychiatry and talk to psychiatrist about need for emotional support animal (after initial assessment visit with therapist) -Continue using self-coping strategies daily to manage emotions 3. Referral(s): Integrated [employer] (In Clinic)  I discussed the assessment and treatment plan with the patient and/or parent/guardian. They were provided an opportunity to ask questions and all were answered. They agreed with the plan and demonstrated an understanding of the instructions.   They were advised to call back or seek an in-person evaluation as appropriate.  I discussed that the purpose of this visit is to provide behavioral health care while limiting exposure to the novel coronavirus.  Discussed there is a possibility of technology failure and discussed alternative modes of communication if that failure occurs.  Valetta Close Tracy Cooper  Depression screen [facility] 2/9 09/12/2019  Decreased Interest 0  Down, Depressed, Hopeless 0  PHQ - 2 Score 0  Altered sleeping 1  Tired, decreased energy 1  Change in appetite 0  Feeling bad or failure about yourself  0  Trouble concentrating 0  Moving slowly or fidgety/restless 0  Suicidal thoughts 0  PHQ-9 Score 2   GAD 7 : Generalized Anxiety Score 09/12/2019  Nervous, Anxious, on Edge 2  Control/stop worrying 2  Worry too much - different things 2  Trouble relaxing 1  Restless 1  Easily annoyed or irritable  1  Afraid - awful might happen 1  Total  GAD 7 Score 10

## 2020-01-24 ENCOUNTER — Other Ambulatory Visit: Payer: Self-pay | Admitting: Obstetrics and Gynecology

## 2020-01-29 ENCOUNTER — Other Ambulatory Visit: Payer: Self-pay | Admitting: Obstetrics and Gynecology

## 2020-01-29 MED ORDER — ESCITALOPRAM OXALATE 5 MG PO TABS
5.0000 mg | ORAL_TABLET | Freq: Every day | ORAL | 1 refills | Status: DC
Start: 2020-01-29 — End: 2020-05-13

## 2020-01-31 ENCOUNTER — Telehealth: Payer: Self-pay | Admitting: Clinical

## 2020-01-31 NOTE — Telephone Encounter (Signed)
error 

## 2020-05-06 ENCOUNTER — Other Ambulatory Visit: Payer: Self-pay | Admitting: Obstetrics and Gynecology

## 2020-05-11 ENCOUNTER — Telehealth: Payer: Self-pay | Admitting: Clinical

## 2020-05-11 NOTE — Telephone Encounter (Signed)
Pt called requesting refill of Lexapro. Pt will not see her PCP for the initial visit until the end of May, but says that PCP office has verbally agreed to take over [facility] medication management at her first visit to their office. Request has been sent to pt's previous prescriber for refill. Pt is aware that if at any time she feels she is having a behavioral health emergency, she may use walk-in 24/7  [facility] at [address], [place], [place] [address].

## 2020-05-13 ENCOUNTER — Telehealth: Payer: Self-pay | Admitting: Clinical

## 2020-05-13 ENCOUNTER — Other Ambulatory Visit: Payer: Self-pay | Admitting: Obstetrics and Gynecology

## 2020-05-13 MED ORDER — ESCITALOPRAM OXALATE 5 MG PO TABS
5.0000 mg | ORAL_TABLET | Freq: Every day | ORAL | 1 refills | Status: DC
Start: 2020-05-13 — End: 2020-12-25

## 2020-05-13 NOTE — Telephone Encounter (Signed)
Left HIPPA-compliant message to call back Jamie from [facility] at [phone] (main office) or [phone] (Jamie's office).   Rebecca W (Supervisor: Jamie McMannes) 

## 2020-05-25 NOTE — Telephone Encounter (Signed)
error 

## 2020-06-10 ENCOUNTER — Telehealth: Payer: Medicaid Other | Admitting: Obstetrics and Gynecology

## 2020-06-10 NOTE — Progress Notes (Signed)
Called pt at [phone] and [phone]; VM left x2. Callback number given and pt encouraged to reschedule if needed.

## 2020-08-11 ENCOUNTER — Other Ambulatory Visit: Payer: Self-pay | Admitting: Obstetrics and Gynecology

## 2020-08-31 ENCOUNTER — Telehealth: Payer: Self-pay | Admitting: Clinical

## 2020-08-31 NOTE — Telephone Encounter (Signed)
Attempt to f/u on pt voicemail, pt requested a call-back from [facility] on 08/28/20;  Left HIPPA-compliant message to call back Asher Muir from [employer] for [employer] at [facility] at  [phone] [facility] office).

## 2020-09-24 ENCOUNTER — Telehealth: Payer: Self-pay | Admitting: Clinical

## 2020-09-24 NOTE — Telephone Encounter (Signed)
Pt is requesting help figuring out what to do after leaving numerous messages with her PCP to schedule an appointment and not hearing back. Pt has not been taking her BH medication since min-June, has been distracted by family children at her house this summer; took OTC medication to help with mood.   Pt is encouraged to do one of the following:  Go to PCP office in-person tomorrow morning to schedule an appointment. Find out if they have a cancellation list to possibly get her in sooner.  Go to [facility] for any [facility] emergency OR call to find out current recommended walk-in hours.   Pt agrees to walk in to PCP office next week to schedule an appointment, so PCP can manage medications. Pt is welcome to contact [facility] Dahna Hattabaugh at [phone] again as needed.

## 2020-12-25 ENCOUNTER — Other Ambulatory Visit: Payer: Self-pay

## 2020-12-25 ENCOUNTER — Encounter (HOSPITAL_COMMUNITY): Payer: Self-pay

## 2020-12-25 ENCOUNTER — Emergency Department (HOSPITAL_COMMUNITY): Payer: Medicaid Other

## 2020-12-25 ENCOUNTER — Emergency Department (HOSPITAL_COMMUNITY)
Admission: EM | Admit: 2020-12-25 | Discharge: 2020-12-25 | Disposition: A | Discharge status: Home / Self Care | Payer: Medicaid Other | Attending: Emergency Medicine | Admitting: Emergency Medicine

## 2020-12-25 DIAGNOSIS — Z3A01 Less than 8 weeks gestation of pregnancy: Secondary | ICD-10-CM | POA: Insufficient documentation

## 2020-12-25 DIAGNOSIS — O26891 Other specified pregnancy related conditions, first trimester: Secondary | ICD-10-CM | POA: Insufficient documentation

## 2020-12-25 DIAGNOSIS — R1031 Right lower quadrant pain: Secondary | ICD-10-CM | POA: Insufficient documentation

## 2020-12-25 DIAGNOSIS — N9489 Other specified conditions associated with female genital organs and menstrual cycle: Secondary | ICD-10-CM | POA: Diagnosis not present

## 2020-12-25 DIAGNOSIS — R110 Nausea: Secondary | ICD-10-CM | POA: Insufficient documentation

## 2020-12-25 DIAGNOSIS — O034 Incomplete spontaneous abortion without complication: Secondary | ICD-10-CM | POA: Insufficient documentation

## 2020-12-25 DIAGNOSIS — E876 Hypokalemia: Secondary | ICD-10-CM

## 2020-12-25 DIAGNOSIS — F17200 Nicotine dependence, unspecified, uncomplicated: Secondary | ICD-10-CM | POA: Diagnosis not present

## 2020-12-25 LAB — GC/CHLAMYDIA PROBE AMP (~~LOC~~) NOT AT ARMC
Chlamydia: NEGATIVE
Comment: NEGATIVE
Comment: NORMAL
Neisseria Gonorrhea: NEGATIVE

## 2020-12-25 LAB — BASIC METABOLIC PANEL
Anion gap: 7 (ref 5–15)
BUN: 9 mg/dL (ref 6–20)
CO2: 26 mmol/L (ref 22–32)
Calcium: 9.1 mg/dL (ref 8.9–10.3)
Chloride: 103 mmol/L (ref 98–111)
Creatinine, Ser: 0.67 mg/dL (ref 0.44–1.00)
GFR, Estimated: >60 mL/min (ref >60)
Glucose, Bld: 127 mg/dL — ABNORMAL HIGH (ref 70–99)
Potassium: 2.9 mmol/L — ABNORMAL LOW (ref 3.5–5.1)
Sodium: 136 mmol/L (ref 135–145)

## 2020-12-25 LAB — CBC WITH DIFFERENTIAL/PLATELET
Abs Immature Granulocytes: 0.01 10*3/uL (ref 0.00–0.07)
Basophils Absolute: 0 10*3/uL (ref 0.0–0.1)
Basophils Relative: 0 %
Eosinophils Absolute: 0.1 10*3/uL (ref 0.0–0.5)
Eosinophils Relative: 1 %
HCT: 38.7 % (ref 36.0–46.0)
Hemoglobin: 12.8 g/dL (ref 12.0–15.0)
Immature Granulocytes: 0 %
Lymphocytes Relative: 36 %
Lymphs Abs: 2.1 10*3/uL (ref 0.7–4.0)
MCH: 29 pg (ref 26.0–34.0)
MCHC: 33.1 g/dL (ref 30.0–36.0)
MCV: 87.8 fL (ref 80.0–100.0)
Monocytes Absolute: 0.5 10*3/uL (ref 0.1–1.0)
Monocytes Relative: 10 %
Neutro Abs: 3 10*3/uL (ref 1.7–7.7)
Neutrophils Relative %: 53 %
Platelets: 237 10*3/uL (ref 150–400)
RBC: 4.41 MIL/uL (ref 3.87–5.11)
RDW: 13.2 % (ref 11.5–15.5)
WBC: 5.7 10*3/uL (ref 4.0–10.5)
nRBC: 0 % (ref 0.0–0.2)

## 2020-12-25 LAB — I-STAT BETA HCG BLOOD, ED (MC, WL, AP ONLY): I-stat hCG, quantitative: 334.8 m[IU]/mL — ABNORMAL HIGH (ref <5)

## 2020-12-25 LAB — HCG, QUANTITATIVE, PREGNANCY: hCG, Beta Chain, Quant, S: 379 m[IU]/mL — ABNORMAL HIGH (ref <5)

## 2020-12-25 MED ORDER — ONDANSETRON HCL 4 MG/2ML IJ SOLN
4.0000 mg | Freq: Once | INTRAMUSCULAR | Status: AC
  Administered 2020-12-25: 4 mg via INTRAVENOUS
  Filled 2020-12-25: qty 2

## 2020-12-25 MED ORDER — POTASSIUM CHLORIDE CRYS ER 20 MEQ PO TBCR: 20.0000 meq | EXTENDED_RELEASE_TABLET | Freq: Every day | ORAL | 0 refills | Status: DC

## 2020-12-25 MED ORDER — HYDROMORPHONE HCL 1 MG/ML IJ SOLN
1.0000 mg | Freq: Once | INTRAMUSCULAR | Status: DC
  Filled 2020-12-25: qty 1

## 2020-12-25 MED ORDER — POTASSIUM CHLORIDE CRYS ER 20 MEQ PO TBCR
40.0000 meq | EXTENDED_RELEASE_TABLET | Freq: Once | ORAL | Status: AC
  Administered 2020-12-25: 40 meq via ORAL
  Filled 2020-12-25: qty 2

## 2020-12-25 MED ORDER — POTASSIUM CHLORIDE 10 MEQ/100ML IV SOLN
10.0000 meq | INTRAVENOUS | Status: AC
  Administered 2020-12-25 (×3): 10 meq via INTRAVENOUS
  Filled 2020-12-25 (×3): qty 100

## 2020-12-25 NOTE — ED Triage Notes (Signed)
Pt reports with pain to her right flank side. Pt states that she only has one fallopian tube and it is her tube hurting her. Pt states that her cycle just started today.

## 2020-12-25 NOTE — ED Provider Notes (Addendum)
[facility]-EMERGENCY DEPT Provider Note: Tracy Dell, MD, FACEP  CSN: [id number] MRN: [id number] ARRIVAL: 12/25/20 at 0014 ROOM: WA22/WA22   CHIEF COMPLAINT  Abdominal Pain   HISTORY OF PRESENT ILLNESS  12/25/20 12:22 AM Tracy Cooper is a 30 y.o. female who is status post left salpingectomy for ectopic pregnancy.  She is here with pain in her right lower quadrant that radiates to her right flank.  The pain came on gradually throughout the day and is now severe (10 out of 10).  She describes it as constant and sharp.  It is worse with movement or palpation.  On her way to the ED this evening she began having vaginal bleeding.  She is having associated nausea without vomiting.  She has right lower quadrant pain with urination but not dysuria.  She does not have a fever.   Past Medical History:  Diagnosis Date   Anxiety     Past Surgical History:  Procedure Laterality Date   CESAREAN SECTION     x 3   DIAGNOSTIC LAPAROSCOPY WITH REMOVAL OF ECTOPIC PREGNANCY N/A 08/19/2019   Procedure: DIAGNOSTIC LAPAROSCOPY WITH Left Salpingectomy and  REMOVAL OF ECTOPIC PREGNANCY;  Surgeon: Adam Phenix, MD;  Location: [facility] OR;  Service: Gynecology;  Laterality: N/A;    Family History  Problem Relation Age of Onset   COPD Mother     Social History   Tobacco Use   Smoking status: Every Day   Smokeless tobacco: Never  Vaping Use   Vaping Use: Every day   Substances: Nicotine, Flavoring  Substance Use Topics   Alcohol use: Never   Drug use: Never    Prior to Admission medications   Not on File    Allergies Patient has no known allergies.   REVIEW OF SYSTEMS  Negative except as noted here or in the History of Present Illness.   PHYSICAL EXAMINATION  Initial Vital Signs Blood pressure (!) 130/94, pulse 84, temperature 98.7 F (37.1 C), temperature source Oral, resp. rate (!) 22, height 5\' 2"  (1.575 m), weight 73 kg, SpO2 94 %.  Examination General: Well-developed,  well-nourished female in no acute distress; appearance consistent with age of record HENT: normocephalic; atraumatic Eyes: pupils equal, round and reactive to light; extraocular muscles intact Neck: supple Heart: regular rate and rhythm Lungs: clear to auscultation bilaterally Abdomen: soft; nondistended; right lower quadrant tenderness; bowel sounds present GU: No CVA tenderness; normal external genitalia; vaginal bleeding; no cervical motion tenderness; no adnexal tenderness; tenderness is up at McBurney's point) Extremities: No deformity; full range of motion; pulses normal Neurologic: Awake, alert and oriented; motor function intact in all extremities and symmetric; no facial droop Skin: Warm and dry Psychiatric: Grimacing   RESULTS  Summary of this visit's results, reviewed and interpreted by myself:   EKG Interpretation  Date/Time:    Ventricular Rate:    PR Interval:    QRS Duration:   QT Interval:    QTC Calculation:   R Axis:     Text Interpretation:         Laboratory Studies: Results for orders placed or performed during the hospital encounter of 12/25/20 (from the past 24 hour(s))  CBC with Differential/Platelet     Status: None   Collection Time: 12/25/20  1:06 AM  Result Value Ref Range   WBC 5.7 4.0 - 10.5 K/uL   RBC 4.41 3.87 - 5.11 MIL/uL   Hemoglobin 12.8 12.0 - 15.0 g/dL   HCT 62.2 63.3 - 35.4 %  MCV 87.8 80.0 - 100.0 fL   MCH 29.0 26.0 - 34.0 pg   MCHC 33.1 30.0 - 36.0 g/dL   RDW 16.1 09.6 - 04.5 %   Platelets 237 150 - 400 K/uL   nRBC 0.0 0.0 - 0.2 %   Neutrophils Relative % 53 %   Neutro Abs 3.0 1.7 - 7.7 K/uL   Lymphocytes Relative 36 %   Lymphs Abs 2.1 0.7 - 4.0 K/uL   Monocytes Relative 10 %   Monocytes Absolute 0.5 0.1 - 1.0 K/uL   Eosinophils Relative 1 %   Eosinophils Absolute 0.1 0.0 - 0.5 K/uL   Basophils Relative 0 %   Basophils Absolute 0.0 0.0 - 0.1 K/uL   Immature Granulocytes 0 %   Abs Immature Granulocytes 0.01 0.00 - 0.07  K/uL  Basic metabolic panel     Status: Abnormal   Collection Time: 12/25/20  1:06 AM  Result Value Ref Range   Sodium 136 135 - 145 mmol/L   Potassium 2.9 (L) 3.5 - 5.1 mmol/L   Chloride 103 98 - 111 mmol/L   CO2 26 22 - 32 mmol/L   Glucose, Bld 127 (H) 70 - 99 mg/dL   BUN 9 6 - 20 mg/dL   Creatinine, Ser 4.09 0.44 - 1.00 mg/dL   Calcium 9.1 8.9 - 81.1 mg/dL   GFR, Estimated >91 >47 mL/min   Anion gap 7 5 - 15  hCG, quantitative, pregnancy     Status: Abnormal   Collection Time: 12/25/20  1:13 AM  Result Value Ref Range   hCG, Beta Chain, Quant, S 379 (H) <5 mIU/mL  I-Stat Beta hCG blood, ED ([facility], [facility], [facility] only)     Status: Abnormal   Collection Time: 12/25/20  1:20 AM  Result Value Ref Range   I-stat hCG, quantitative 334.8 (H) <5 mIU/mL   Comment 3           Imaging Studies: US OB Comp Less 14 Wks  Result Date: 12/25/2020 CLINICAL DATA:  Right lower quadrant abdominal pain. LMP: 11/24/2020 corresponding to an estimated gestational age of [redacted] weeks, 3 days. EXAM: OBSTETRIC <14 WK [place] AND TRANSVAGINAL OB US DOPPLER ULTRASOUND OF OVARIES TECHNIQUE: Both transabdominal and transvaginal ultrasound examinations were performed for complete evaluation of the gestation as well as the maternal uterus, adnexal regions, and pelvic cul-de-sac. Transvaginal technique was performed to assess early pregnancy. Color and duplex Doppler ultrasound was utilized to evaluate blood flow to the ovaries. COMPARISON:  None. FINDINGS: Intrauterine gestational sac: Single intrauterine gestational sac. Yolk sac:  Not seen Embryo:  Present Cardiac Activity: Not detected Heart Rate: 0 bpm CRL:   8 mm   6 w 5 d                  [place] EDC: 08/15/2021 Subchorionic hemorrhage: There is a subchorionic hemorrhage measuring 1.5 x 0.7 x 2.0 cm. Maternal uterus/adnexae: The maternal ovaries unremarkable. Pulsed Doppler evaluation of both ovaries demonstrates normal appearing low-resistance arterial and venous waveforms. IMPRESSION:  Failed early pregnancy at 6 weeks, 5 days gestation. These results were called by telephone at the time of interpretation on 12/25/2020 at 2:43 am to provider [facility] , who verbally acknowledged these results. Electronically Signed   By: Elgie Collard M.D.   On: 12/25/2020 02:46   US OB Transvaginal  Result Date: 12/25/2020 CLINICAL DATA:  Right lower quadrant abdominal pain. LMP: 11/24/2020 corresponding to an estimated gestational age of [redacted] weeks, 3 days. EXAM: OBSTETRIC <  14 WK [place] AND TRANSVAGINAL OB US DOPPLER ULTRASOUND OF OVARIES TECHNIQUE: Both transabdominal and transvaginal ultrasound examinations were performed for complete evaluation of the gestation as well as the maternal uterus, adnexal regions, and pelvic cul-de-sac. Transvaginal technique was performed to assess early pregnancy. Color and duplex Doppler ultrasound was utilized to evaluate blood flow to the ovaries. COMPARISON:  None. FINDINGS: Intrauterine gestational sac: Single intrauterine gestational sac. Yolk sac:  Not seen Embryo:  Present Cardiac Activity: Not detected Heart Rate: 0 bpm CRL:   8 mm   6 w 5 d                  [place] EDC: 08/15/2021 Subchorionic hemorrhage: There is a subchorionic hemorrhage measuring 1.5 x 0.7 x 2.0 cm. Maternal uterus/adnexae: The maternal ovaries unremarkable. Pulsed Doppler evaluation of both ovaries demonstrates normal appearing low-resistance arterial and venous waveforms. IMPRESSION: Failed early pregnancy at 6 weeks, 5 days gestation. These results were called by telephone at the time of interpretation on 12/25/2020 at 2:43 am to provider [facility] , who verbally acknowledged these results. Electronically Signed   By: Elgie Collard M.D.   On: 12/25/2020 02:46   US PELVIC DOPPLER (TORSION R/O OR MASS ARTERIAL FLOW)  Result Date: 12/25/2020 CLINICAL DATA:  Right lower quadrant abdominal pain. LMP: 11/24/2020 corresponding to an estimated gestational age of [redacted] weeks, 3 days. EXAM: OBSTETRIC  <14 WK [place] AND TRANSVAGINAL OB US DOPPLER ULTRASOUND OF OVARIES TECHNIQUE: Both transabdominal and transvaginal ultrasound examinations were performed for complete evaluation of the gestation as well as the maternal uterus, adnexal regions, and pelvic cul-de-sac. Transvaginal technique was performed to assess early pregnancy. Color and duplex Doppler ultrasound was utilized to evaluate blood flow to the ovaries. COMPARISON:  None. FINDINGS: Intrauterine gestational sac: Single intrauterine gestational sac. Yolk sac:  Not seen Embryo:  Present Cardiac Activity: Not detected Heart Rate: 0 bpm CRL:   8 mm   6 w 5 d                  [place] EDC: 08/15/2021 Subchorionic hemorrhage: There is a subchorionic hemorrhage measuring 1.5 x 0.7 x 2.0 cm. Maternal uterus/adnexae: The maternal ovaries unremarkable. Pulsed Doppler evaluation of both ovaries demonstrates normal appearing low-resistance arterial and venous waveforms. IMPRESSION: Failed early pregnancy at 6 weeks, 5 days gestation. These results were called by telephone at the time of interpretation on 12/25/2020 at 2:43 am to provider [facility] , who verbally acknowledged these results. Electronically Signed   By: Elgie Collard M.D.   On: 12/25/2020 02:46    ED COURSE and MDM  Nursing notes, initial and subsequent vitals signs, including pulse oximetry, reviewed and interpreted by myself.  Vitals:   12/25/20 0018 12/25/20 0230  BP: (!) 130/94 112/76  Pulse: 84 64  Resp: (!) 22 18  Temp: 98.7 F (37.1 C)   TempSrc: Oral   SpO2: 94% 99%  Weight: 73 kg   Height: 5\' 2"  (1.575 m)    Medications  HYDROmorphone (DILAUDID) injection 1 mg (0 mg Intravenous Hold 12/25/20 0126)  potassium chloride 10 mEq in 100 mL IVPB (10 mEq Intravenous New Bag/Given 12/25/20 0230)  ondansetron (ZOFRAN) injection 4 mg (4 mg Intravenous Given 12/25/20 0100)  potassium chloride SA (KLOR-CON) CR tablet 40 mEq (40 mEq Oral Given 12/25/20 0230)   2:57 AM Patient's pain is  resolved at this time although she still has some minimal right lower quadrant tenderness.  Ultrasound is consistent with a failed  pregnancy so this represents an incomplete miscarriage.  She was advised she may complete the miscarriage or she may require a D&C.  She was advised to contact her OB/GYN, Dr. Debroah Loop, today.  Patient is A-positive, Rhophylac not indicated.  The cause of the patient's hypokalemia is unclear.   PROCEDURES  Procedures   ED DIAGNOSES     ICD-10-CM   1. Incomplete miscarriage  O03.4     2. RLQ abdominal pain  R10.31 US PELVIC DOPPLER (TORSION R/O OR MASS ARTERIAL FLOW)    US PELVIC DOPPLER (TORSION R/O OR MASS ARTERIAL FLOW)    CANCELED: US PELVIC COMPLETE W TRANSVAGINAL AND TORSION R/O    CANCELED: US PELVIC COMPLETE W TRANSVAGINAL AND TORSION R/O    3. Hypokalemia  E87.6          Dontrae Morini, Jonny Ruiz, MD 12/25/20 0302    Paula Libra, MD 12/25/20 [phone]

## 2021-01-15 ENCOUNTER — Ambulatory Visit: Payer: Medicaid Other | Admitting: Advanced Practice Midwife

## 2021-06-02 IMAGING — US US OB COMP LESS 14 WK
1 series · 13 of 28 positions shown · non-contrast
Comparison: None.

CLINICAL DATA: Pelvic pain and vaginal bleeding for 3 days. Beta
HCG [DATE]. Last menstrual period 06/19/2019.

EXAM:
OBSTETRIC <14 WK US AND TRANSVAGINAL OB US
TECHNIQUE: Both transabdominal and transvaginal ultrasound examinations were
performed for complete evaluation of the gestation as well as the
maternal uterus, adnexal regions, and pelvic cul-de-sac.
Transvaginal technique was performed to assess early pregnancy.

[Series 1: us ob comp less 14 wk · 89 acquisitions, 13 frames shown]
[im 4/89]
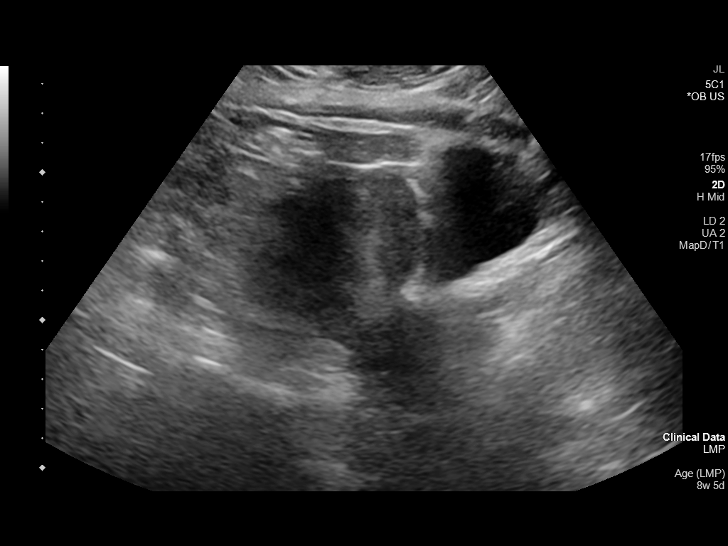
[im 10/89]
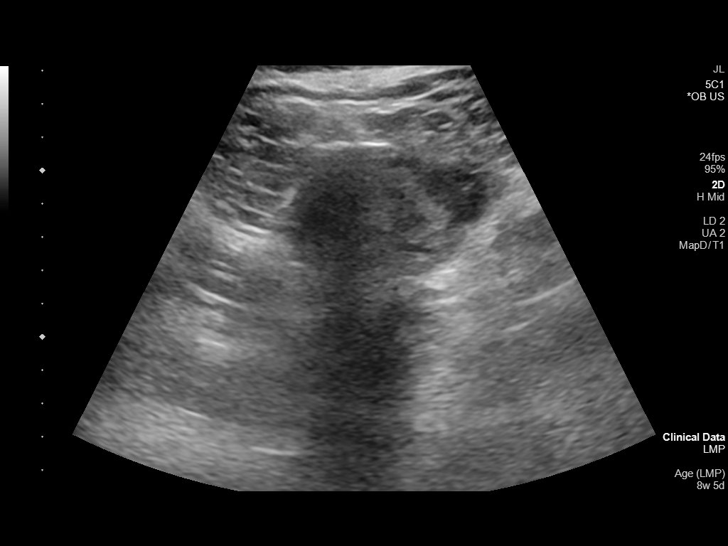
[im 17/89]
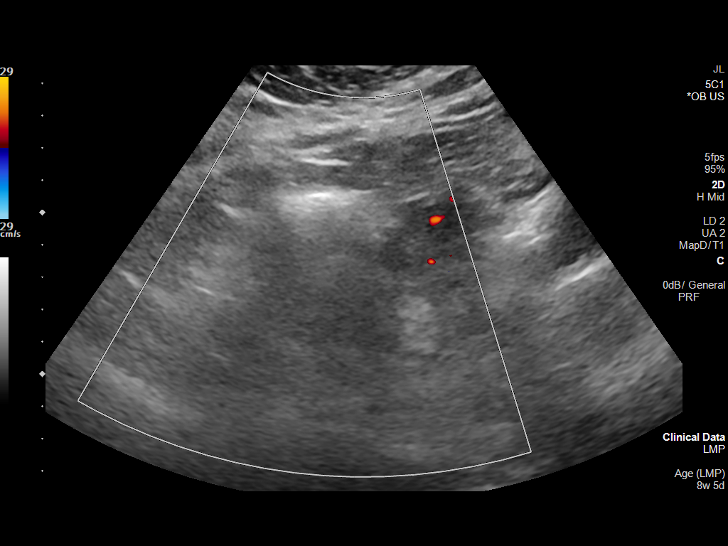
[im 23/89]
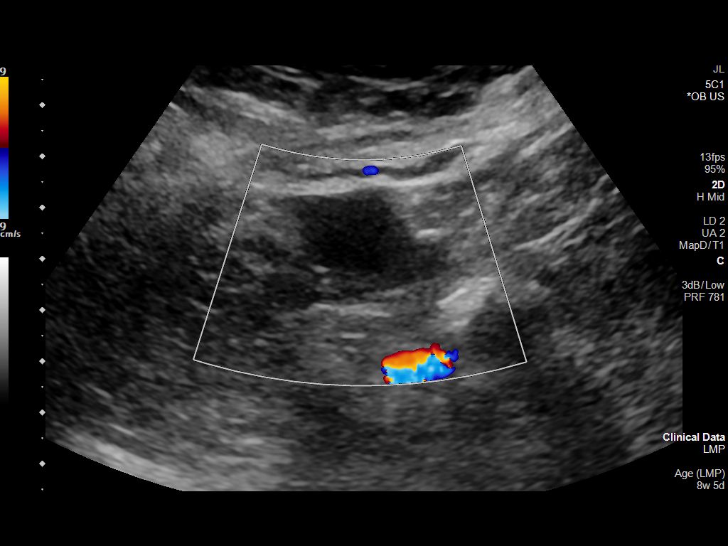
[im 30/89]
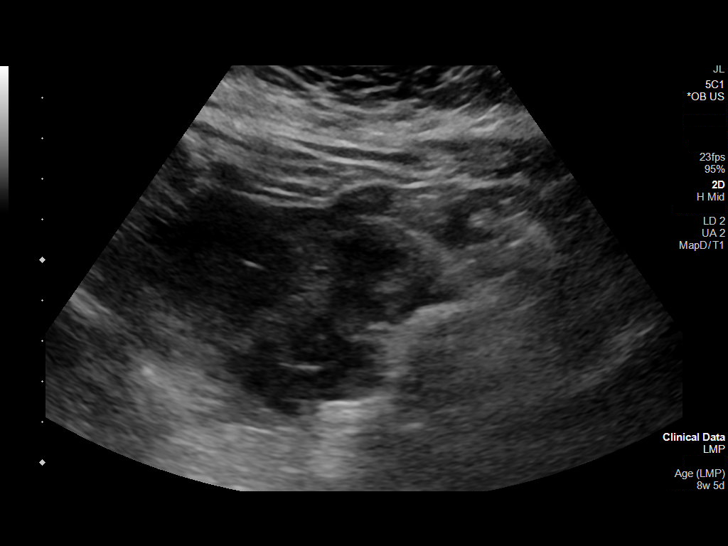
[im 36/89]
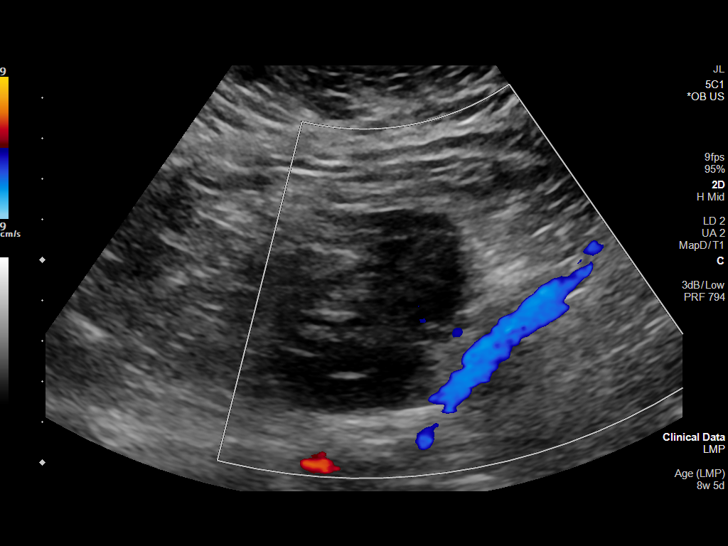
[im 46/89]
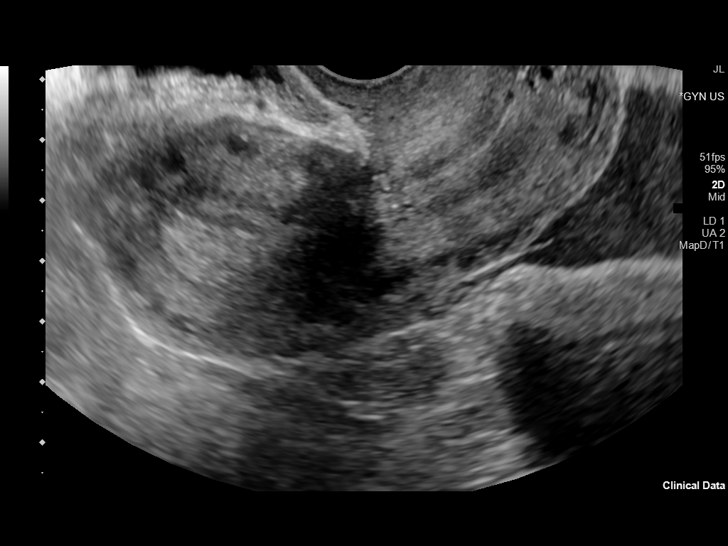
[im 53/89]
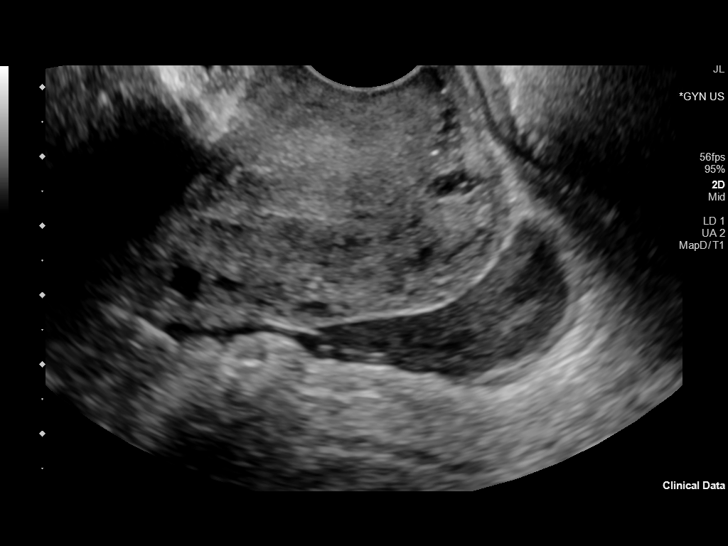
[im 59/89]
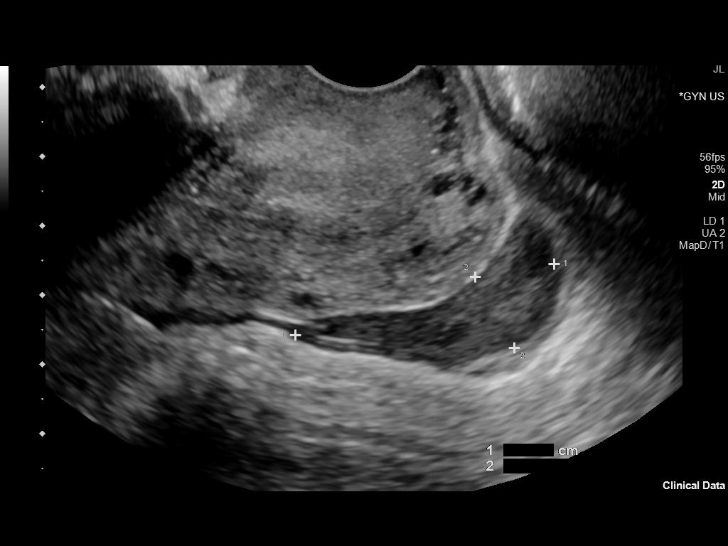
[im 66/89]
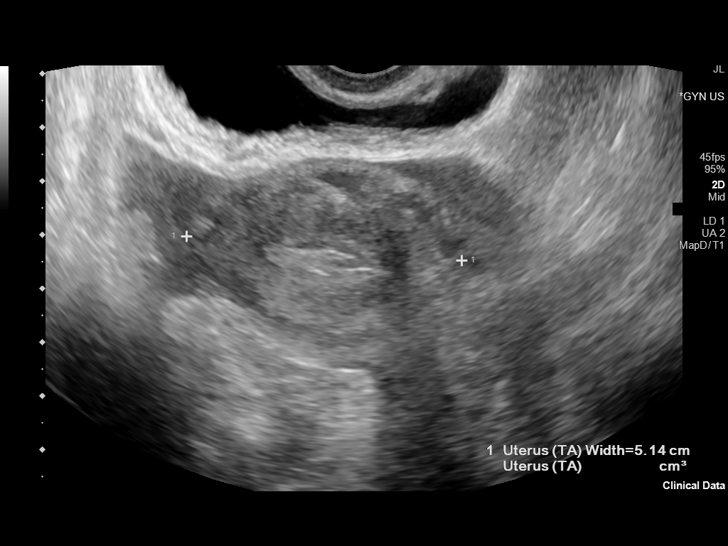
[im 72/89]
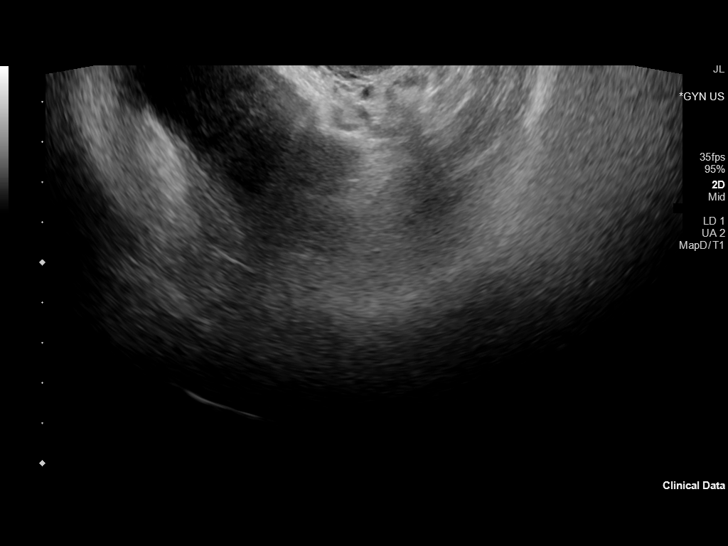
[im 79/89]
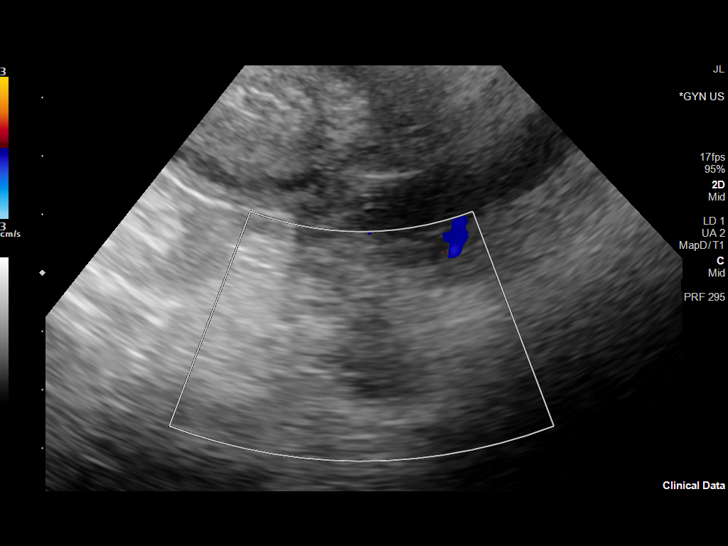
[im 85/89]
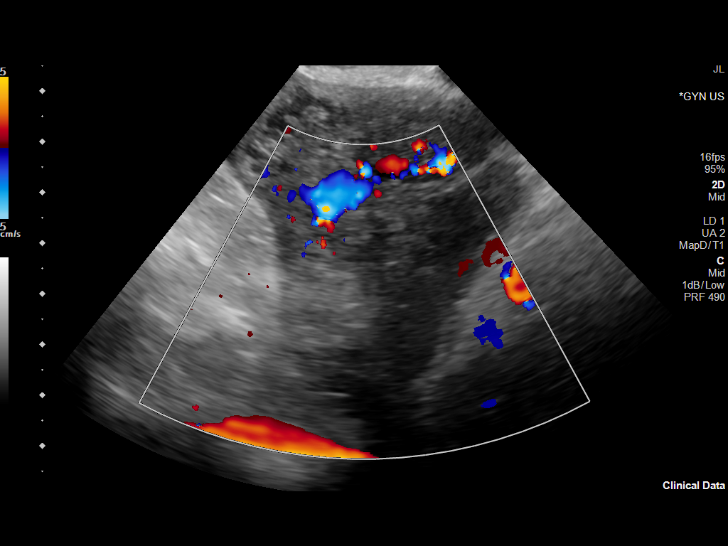

[13 of 28 positions shown; findings below may reference images not displayed]

FINDINGS: Intrauterine gestational sac: None

Yolk sac:  Not Visualized.

Embryo:  Not Visualized.

Cardiac Activity: Not Visualized.

Subchorionic hemorrhage:  None visualized.

Maternal uterus/adnexae: The endometrium measures 14 mm in
thickness. A right ovarian cyst versus dominant follicle measures
2.1 x 1.6 x 1.9 cm. A rounded structure is seen in the left adnexa
measuring 3.2 x 2.5 cm. There is moderate volume free
intraperitoneal fluid with debris, concerning for blood.
IMPRESSION: No intrauterine gestational sac is identified. A rounded structure
is seen in the left adnexa and complex fluid is seen within the
pelvis which is concerning for blood products. This constellation of
findings is suspicious for a ruptured ectopic pregnancy.

These results were called by telephone at the time of interpretation
on 08/19/2019 at [DATE] to provider KHEZR MONSEF , who verbally
acknowledged these results.

## 2022-05-24 DIAGNOSIS — F410 Panic disorder [episodic paroxysmal anxiety] without agoraphobia: Secondary | ICD-10-CM | POA: Diagnosis not present

## 2022-05-26 DIAGNOSIS — F411 Generalized anxiety disorder: Secondary | ICD-10-CM | POA: Diagnosis not present

## 2022-05-26 DIAGNOSIS — F4312 Post-traumatic stress disorder, chronic: Secondary | ICD-10-CM | POA: Diagnosis not present

## 2022-05-26 DIAGNOSIS — F410 Panic disorder [episodic paroxysmal anxiety] without agoraphobia: Secondary | ICD-10-CM | POA: Diagnosis not present

## 2022-05-27 DIAGNOSIS — F410 Panic disorder [episodic paroxysmal anxiety] without agoraphobia: Secondary | ICD-10-CM | POA: Diagnosis not present

## 2022-06-02 DIAGNOSIS — F410 Panic disorder [episodic paroxysmal anxiety] without agoraphobia: Secondary | ICD-10-CM | POA: Diagnosis not present

## 2022-06-07 DIAGNOSIS — F410 Panic disorder [episodic paroxysmal anxiety] without agoraphobia: Secondary | ICD-10-CM | POA: Diagnosis not present

## 2022-06-09 DIAGNOSIS — F410 Panic disorder [episodic paroxysmal anxiety] without agoraphobia: Secondary | ICD-10-CM | POA: Diagnosis not present

## 2022-06-14 DIAGNOSIS — F410 Panic disorder [episodic paroxysmal anxiety] without agoraphobia: Secondary | ICD-10-CM | POA: Diagnosis not present

## 2022-06-21 DIAGNOSIS — F410 Panic disorder [episodic paroxysmal anxiety] without agoraphobia: Secondary | ICD-10-CM | POA: Diagnosis not present

## 2022-06-23 DIAGNOSIS — F410 Panic disorder [episodic paroxysmal anxiety] without agoraphobia: Secondary | ICD-10-CM | POA: Diagnosis not present

## 2022-06-28 DIAGNOSIS — F410 Panic disorder [episodic paroxysmal anxiety] without agoraphobia: Secondary | ICD-10-CM | POA: Diagnosis not present

## 2022-06-30 DIAGNOSIS — F410 Panic disorder [episodic paroxysmal anxiety] without agoraphobia: Secondary | ICD-10-CM | POA: Diagnosis not present

## 2022-07-05 DIAGNOSIS — F410 Panic disorder [episodic paroxysmal anxiety] without agoraphobia: Secondary | ICD-10-CM | POA: Diagnosis not present

## 2022-07-07 DIAGNOSIS — F410 Panic disorder [episodic paroxysmal anxiety] without agoraphobia: Secondary | ICD-10-CM | POA: Diagnosis not present

## 2022-07-14 DIAGNOSIS — F410 Panic disorder [episodic paroxysmal anxiety] without agoraphobia: Secondary | ICD-10-CM | POA: Diagnosis not present

## 2022-10-09 IMAGING — US US OB COMP LESS 14 WK
1 series · 15 of 28 positions shown · non-contrast
Comparison: None.

CLINICAL DATA: Right lower quadrant abdominal pain. LMP: 11/24/2020
corresponding to an estimated gestational age of 4 weeks, 3 days.



[Series 1: us ob comp less 14 wks mc & wl · 87 acquisitions, 15 frames shown]
[im 1/87]
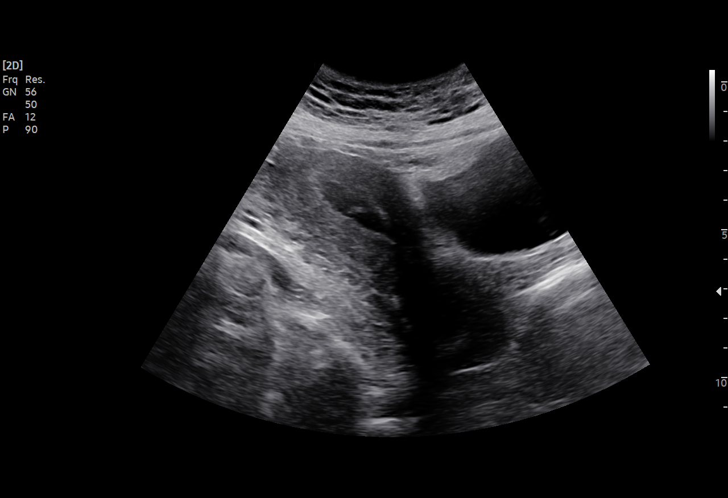
[im 7/87]
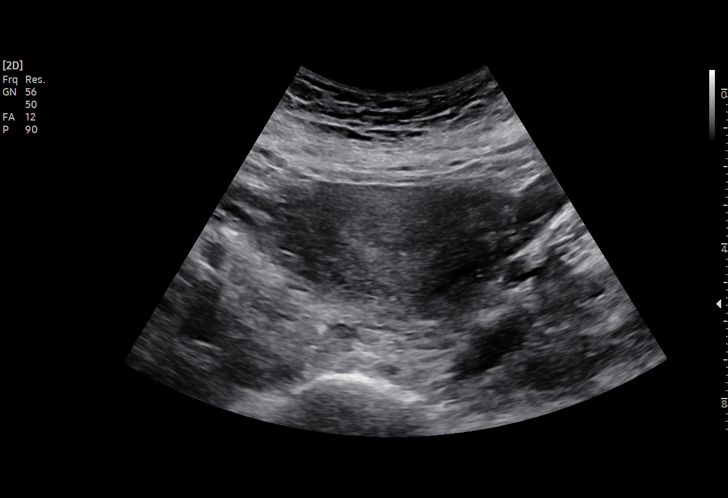
[im 13/87]
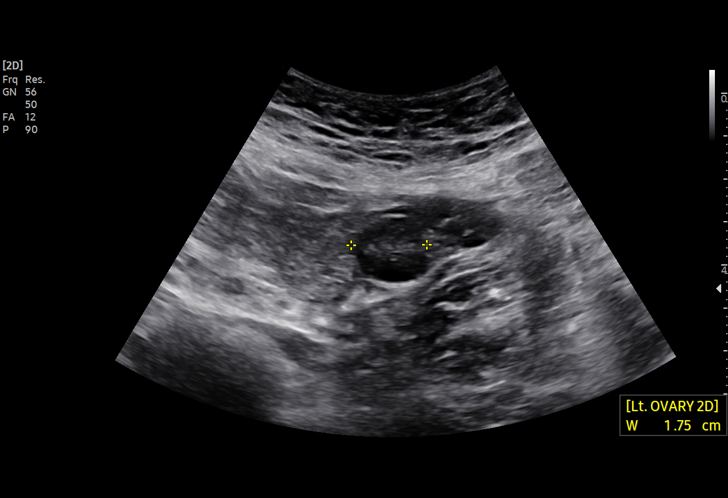
[im 20/87]
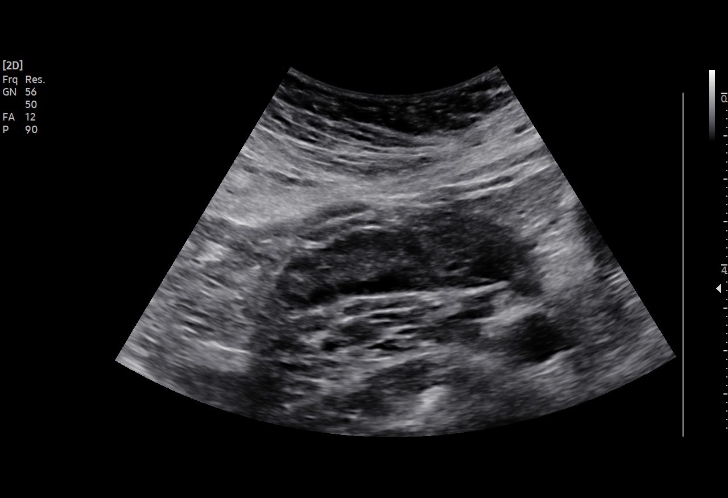
[im 26/87]
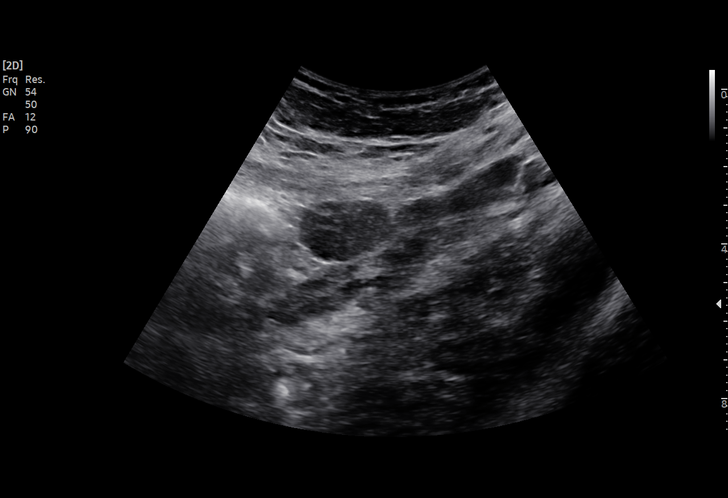
[im 32/87]
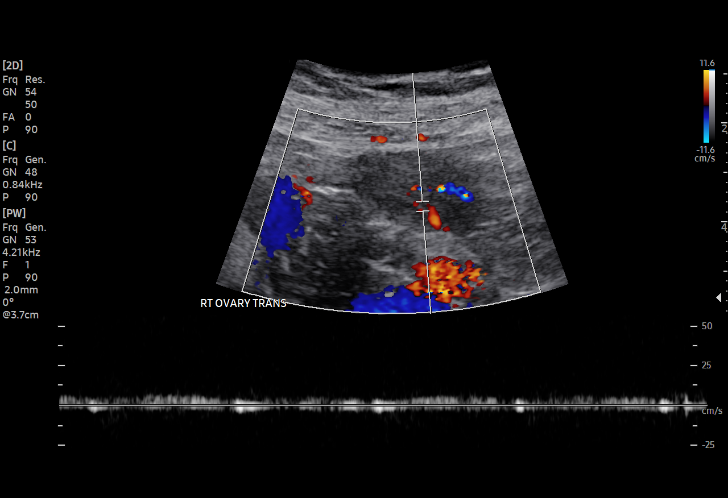
[im 39/87]
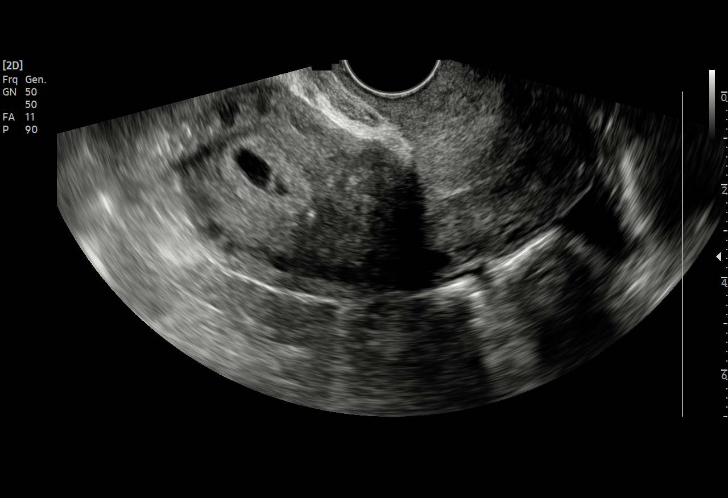
[im 45/87]
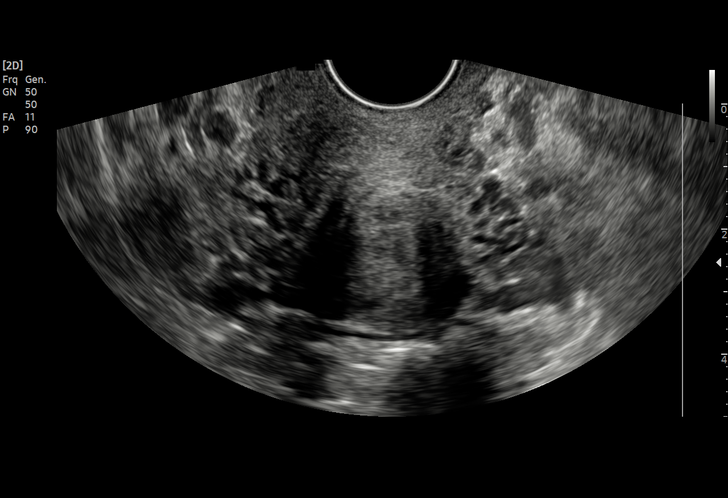
[im 48/87]
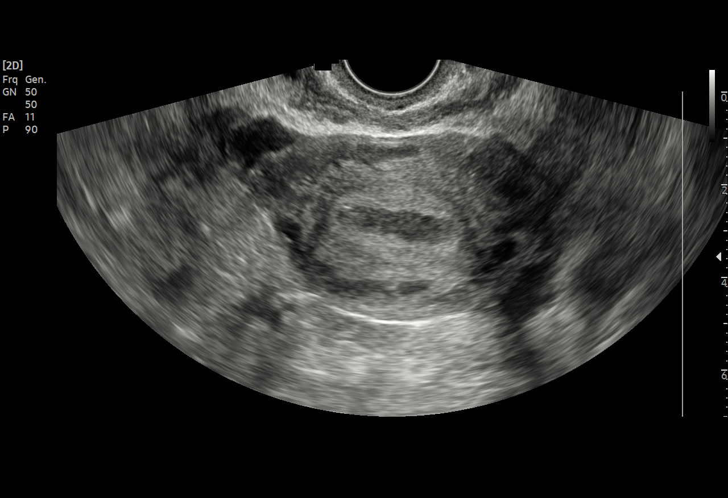
[im 55/87]
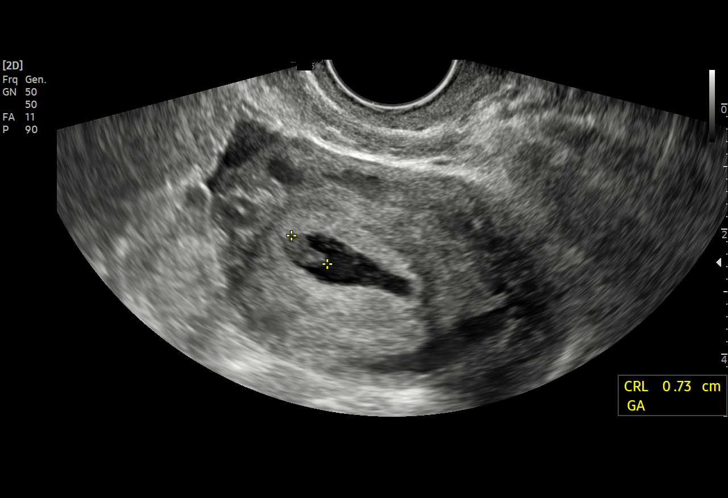
[im 61/87]
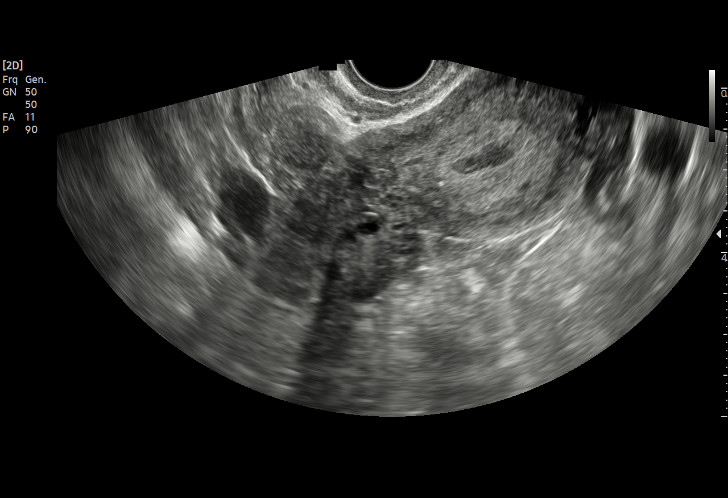
[im 67/87]
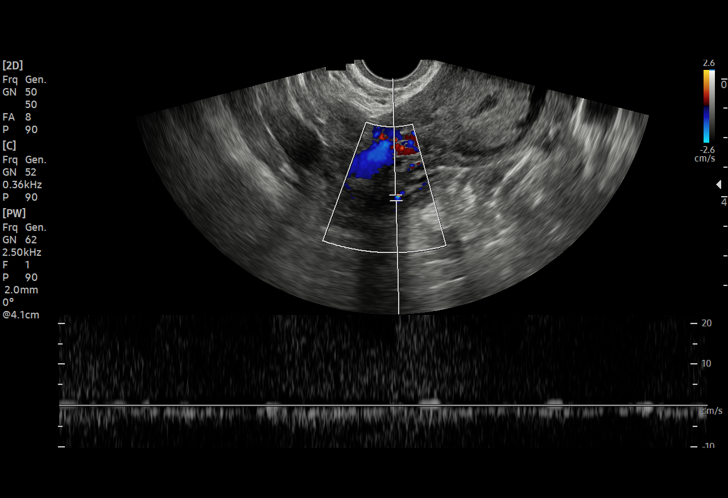
[im 74/87]
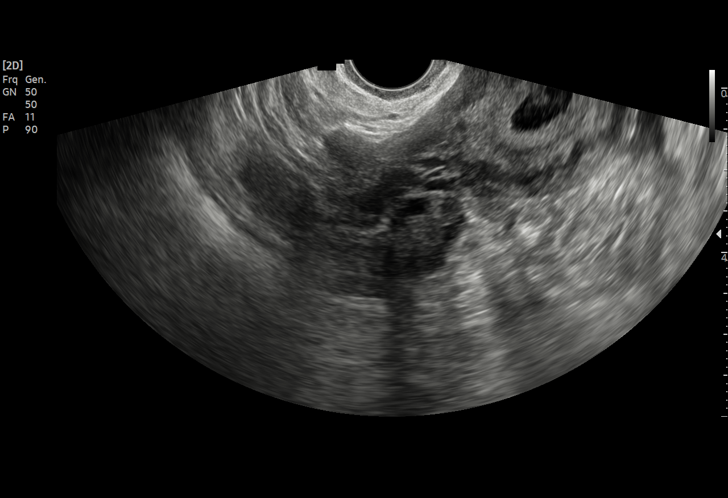
[im 80/87]
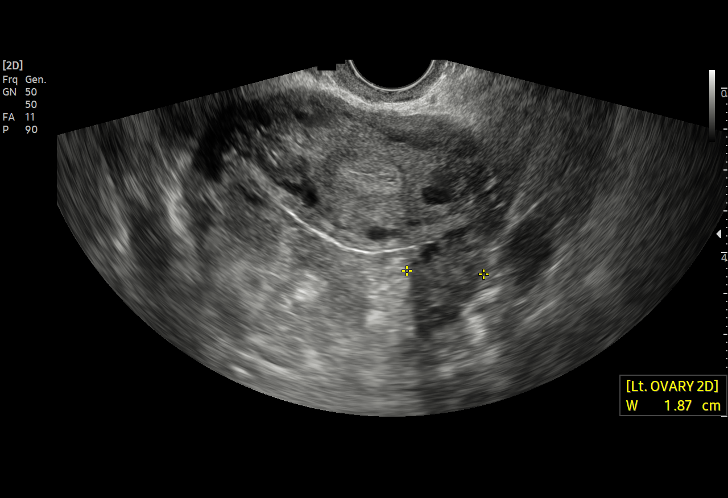
[im 87/87]
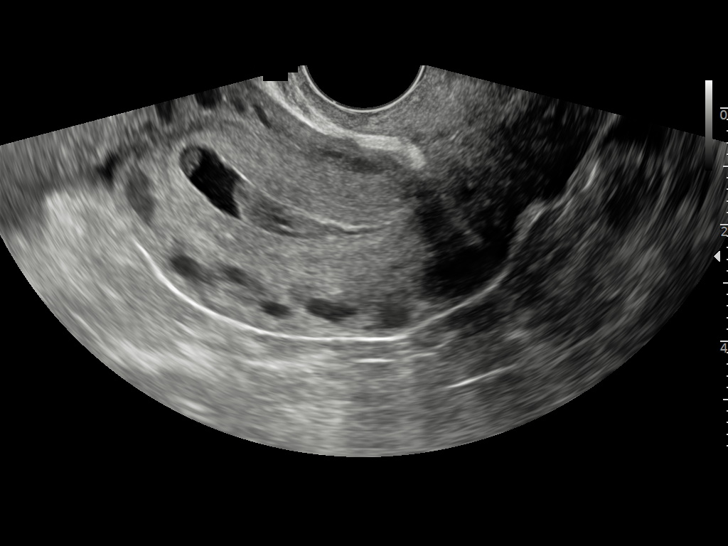

[15 of 28 positions shown; findings below may reference images not displayed]

FINDINGS: Intrauterine gestational sac: Single intrauterine gestational sac.

Yolk sac:  Not seen

Embryo:  Present

Cardiac Activity: Not detected

Heart Rate: 0 bpm

CRL:   8 mm   6 w 5 d                  US EDC: 08/15/2021

Subchorionic hemorrhage: There is a subchorionic hemorrhage
measuring 1.5 x 0.7 x 2.0 cm.

Maternal uterus/adnexae: The maternal ovaries unremarkable.

Pulsed Doppler evaluation of both ovaries demonstrates normal
appearing low-resistance arterial and venous waveforms.
IMPRESSION: Failed early pregnancy at 6 weeks, 5 days gestation.

These results were called by telephone at the time of interpretation
on 12/25/2020 at [DATE] to provider KARLITA BG , who verbally
acknowledged these results.

## 2022-10-09 IMAGING — US US ART/VEN ABD/PELV/SCROTUM DOPPLER LTD
1 series · 15 of 25 positions shown · non-contrast
Comparison: None.

CLINICAL DATA: Right lower quadrant abdominal pain. LMP: 11/24/2020
corresponding to an estimated gestational age of 4 weeks, 3 days.



[Series 1: us ob comp less 14 wks mc & wl · 87 acquisitions, 15 frames shown]
[im 1/87]
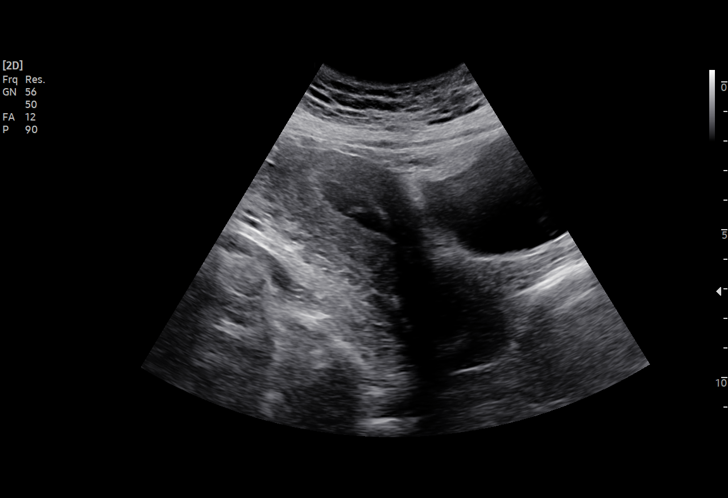
[im 8/87]
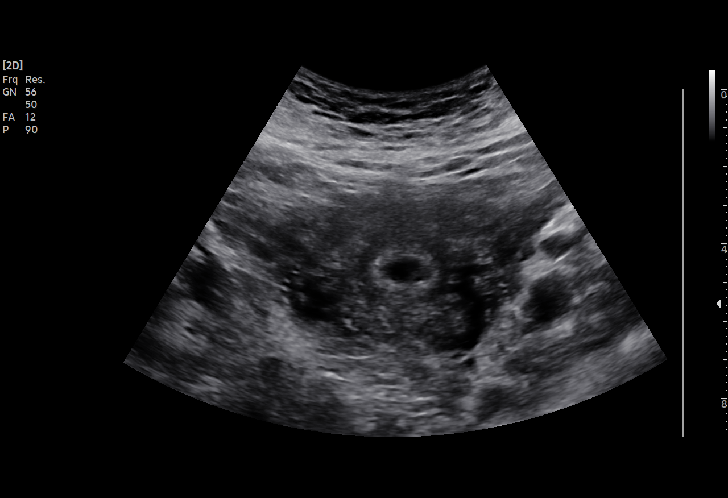
[im 15/87]
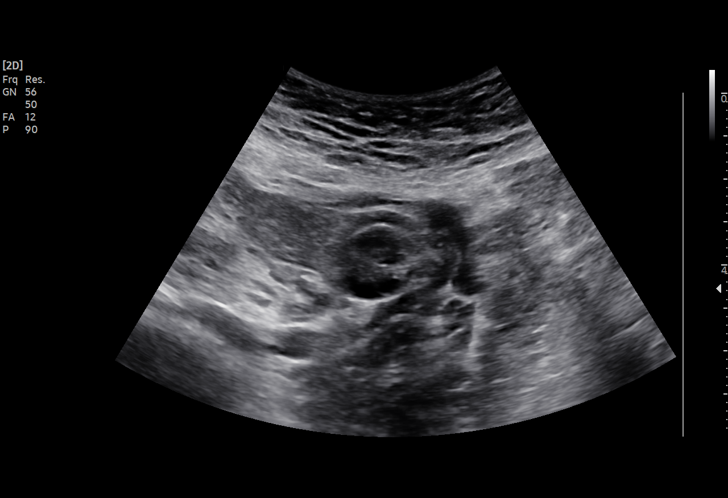
[im 18/87]
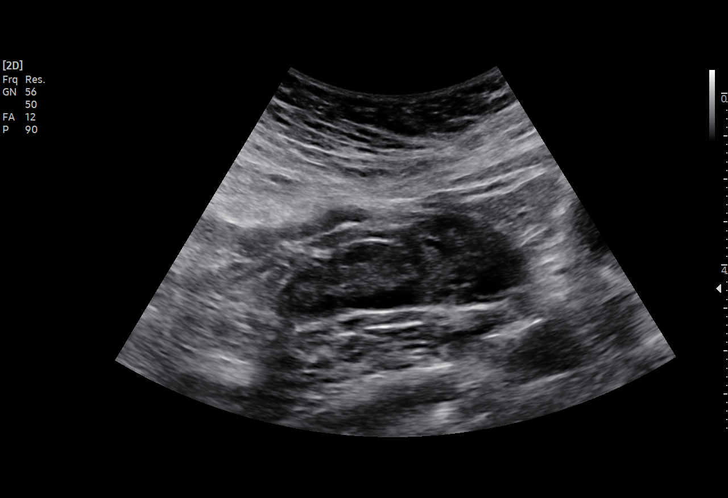
[im 26/87]
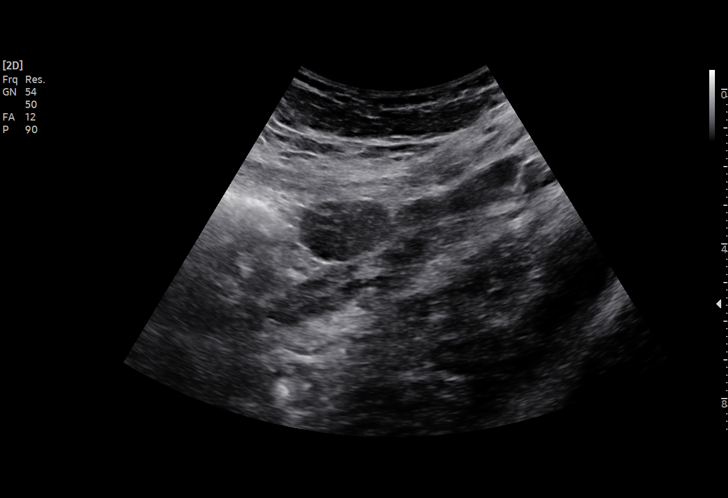
[im 33/87]
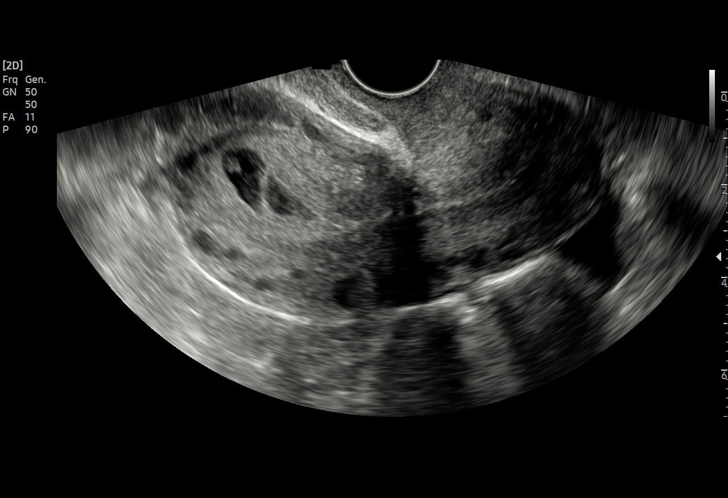
[im 36/87]
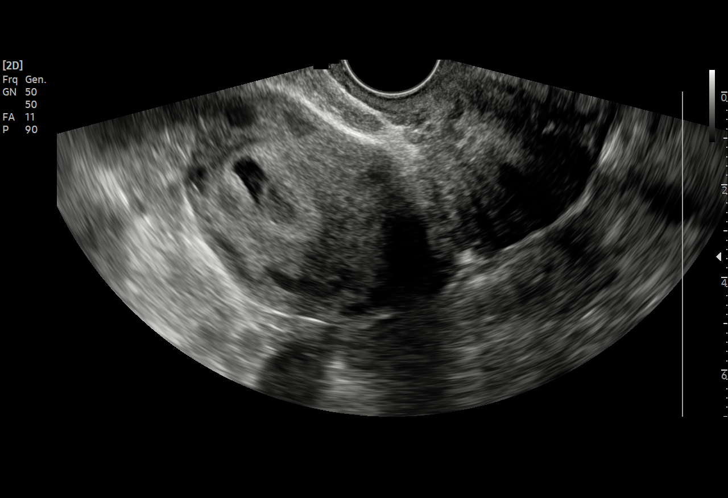
[im 44/87]
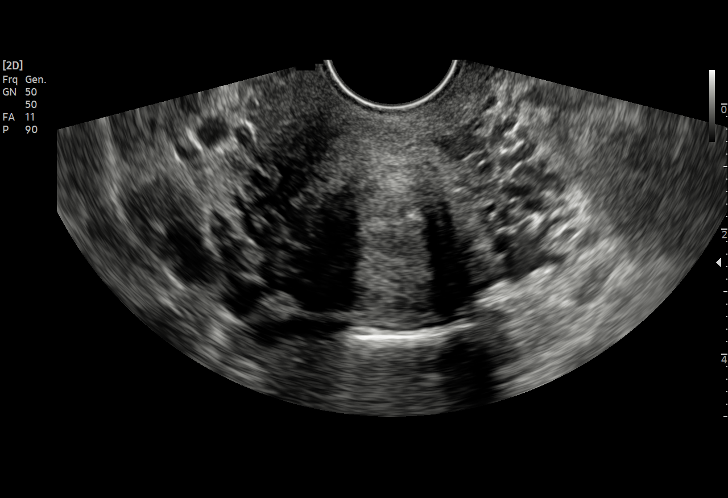
[im 51/87]
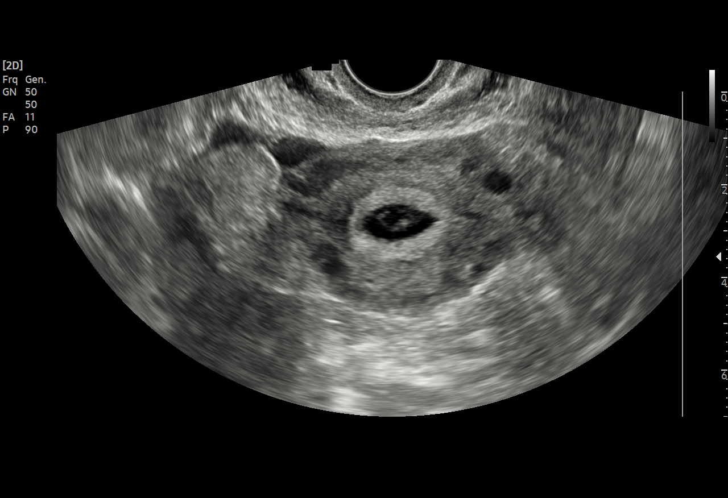
[im 54/87]
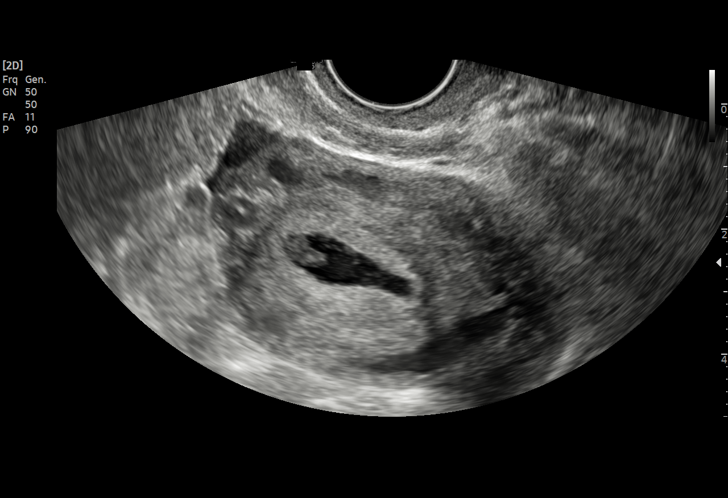
[im 61/87]
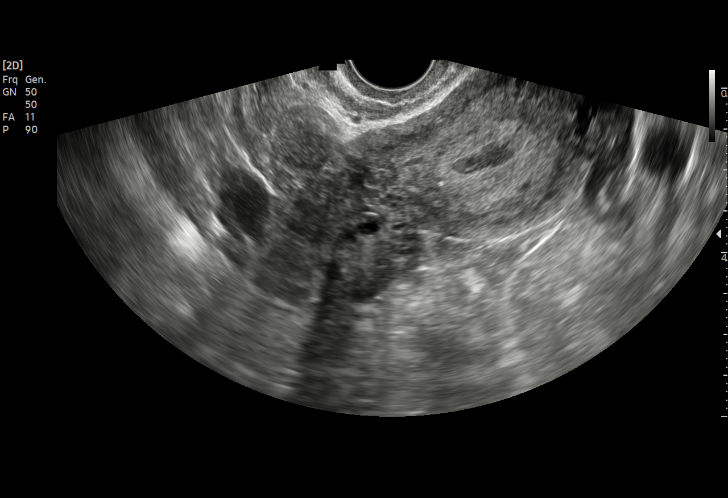
[im 69/87]
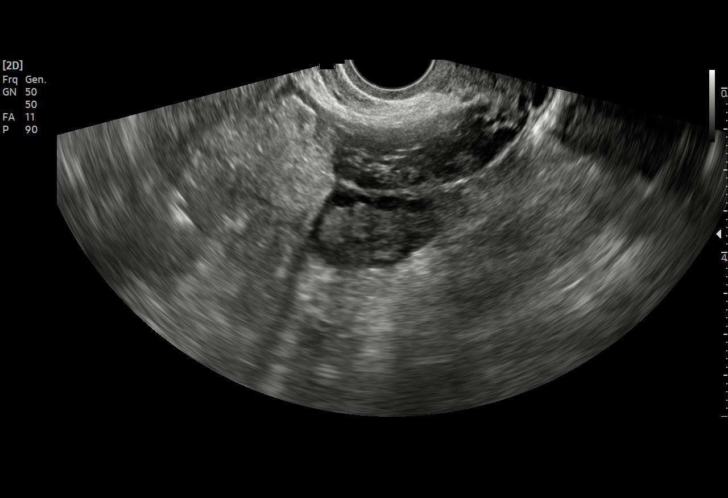
[im 72/87]
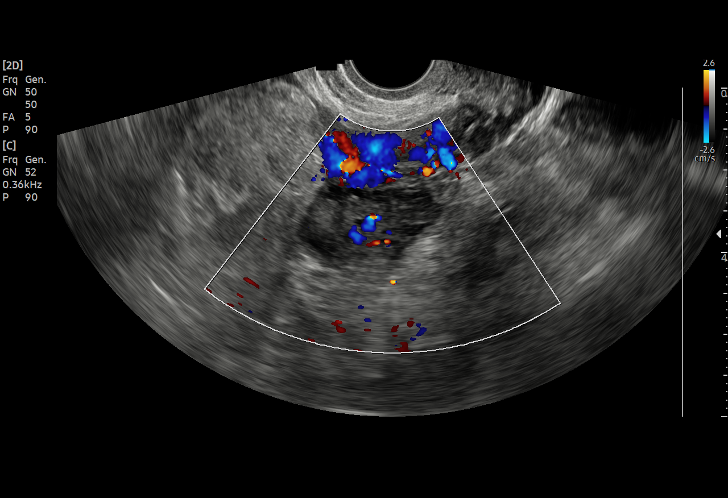
[im 79/87]
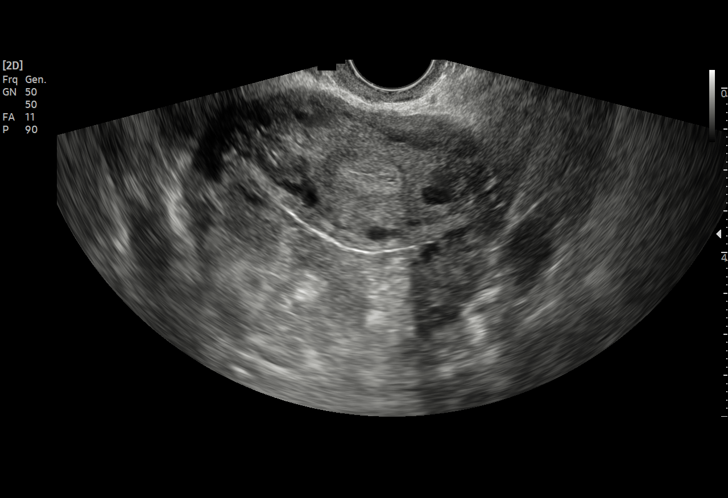
[im 87/87]
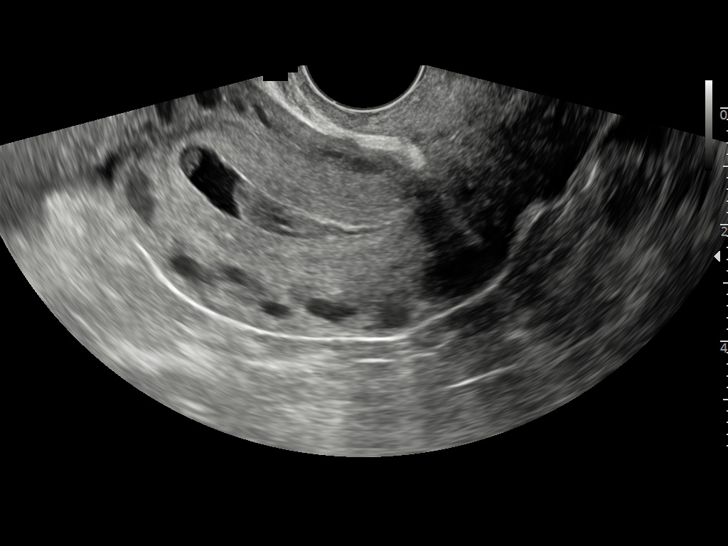

[15 of 25 positions shown; findings below may reference images not displayed]

FINDINGS: Intrauterine gestational sac: Single intrauterine gestational sac.

Yolk sac:  Not seen

Embryo:  Present

Cardiac Activity: Not detected

Heart Rate: 0 bpm

CRL:   8 mm   6 w 5 d                  US EDC: 08/15/2021

Subchorionic hemorrhage: There is a subchorionic hemorrhage
measuring 1.5 x 0.7 x 2.0 cm.

Maternal uterus/adnexae: The maternal ovaries unremarkable.

Pulsed Doppler evaluation of both ovaries demonstrates normal
appearing low-resistance arterial and venous waveforms.
IMPRESSION: Failed early pregnancy at 6 weeks, 5 days gestation.

These results were called by telephone at the time of interpretation
on 12/25/2020 at [DATE] to provider KARLITA BG , who verbally
acknowledged these results.

## 2022-10-24 DIAGNOSIS — F4312 Post-traumatic stress disorder, chronic: Secondary | ICD-10-CM | POA: Diagnosis not present

## 2022-10-24 DIAGNOSIS — F411 Generalized anxiety disorder: Secondary | ICD-10-CM | POA: Diagnosis not present

## 2022-10-24 DIAGNOSIS — F410 Panic disorder [episodic paroxysmal anxiety] without agoraphobia: Secondary | ICD-10-CM | POA: Diagnosis not present

## 2022-11-03 ENCOUNTER — Inpatient Hospital Stay (HOSPITAL_COMMUNITY)
Admission: AD | Admit: 2022-11-03 | Discharge: 2022-11-06 | DRG: 785 | Disposition: A | Discharge status: Home / Self Care | Payer: BC Managed Care – PPO | Attending: Obstetrics & Gynecology | Admitting: Obstetrics & Gynecology

## 2022-11-03 ENCOUNTER — Inpatient Hospital Stay (HOSPITAL_COMMUNITY): Payer: BC Managed Care – PPO

## 2022-11-03 ENCOUNTER — Encounter (HOSPITAL_COMMUNITY)
Admission: AD | Disposition: A | Discharge status: Home / Self Care | Payer: Self-pay | Source: Home / Self Care | Attending: Obstetrics and Gynecology

## 2022-11-03 ENCOUNTER — Inpatient Hospital Stay (HOSPITAL_COMMUNITY): Payer: BC Managed Care – PPO | Admitting: Anesthesiology

## 2022-11-03 ENCOUNTER — Other Ambulatory Visit: Payer: Self-pay

## 2022-11-03 ENCOUNTER — Encounter (HOSPITAL_COMMUNITY): Payer: Self-pay | Admitting: *Deleted

## 2022-11-03 DIAGNOSIS — O0933 Supervision of pregnancy with insufficient antenatal care, third trimester: Secondary | ICD-10-CM

## 2022-11-03 DIAGNOSIS — Z3A39 39 weeks gestation of pregnancy: Secondary | ICD-10-CM | POA: Diagnosis not present

## 2022-11-03 DIAGNOSIS — Z825 Family history of asthma and other chronic lower respiratory diseases: Secondary | ICD-10-CM

## 2022-11-03 DIAGNOSIS — O34211 Maternal care for low transverse scar from previous cesarean delivery: Principal | ICD-10-CM | POA: Diagnosis present

## 2022-11-03 DIAGNOSIS — Z87891 Personal history of nicotine dependence: Secondary | ICD-10-CM | POA: Diagnosis not present

## 2022-11-03 DIAGNOSIS — N838 Other noninflammatory disorders of ovary, fallopian tube and broad ligament: Secondary | ICD-10-CM | POA: Diagnosis not present

## 2022-11-03 DIAGNOSIS — F419 Anxiety disorder, unspecified: Secondary | ICD-10-CM | POA: Diagnosis not present

## 2022-11-03 DIAGNOSIS — O34219 Maternal care for unspecified type scar from previous cesarean delivery: Secondary | ICD-10-CM

## 2022-11-03 DIAGNOSIS — O26893 Other specified pregnancy related conditions, third trimester: Secondary | ICD-10-CM | POA: Diagnosis not present

## 2022-11-03 DIAGNOSIS — O0930 Supervision of pregnancy with insufficient antenatal care, unspecified trimester: Secondary | ICD-10-CM

## 2022-11-03 DIAGNOSIS — Z98891 History of uterine scar from previous surgery: Secondary | ICD-10-CM

## 2022-11-03 DIAGNOSIS — Z79899 Other long term (current) drug therapy: Secondary | ICD-10-CM

## 2022-11-03 DIAGNOSIS — Z302 Encounter for sterilization: Secondary | ICD-10-CM

## 2022-11-03 DIAGNOSIS — O99344 Other mental disorders complicating childbirth: Secondary | ICD-10-CM | POA: Diagnosis not present

## 2022-11-03 DIAGNOSIS — Z9079 Acquired absence of other genital organ(s): Secondary | ICD-10-CM

## 2022-11-03 HISTORY — PX: UNILATERAL SALPINGECTOMY: SHX6160

## 2022-11-03 LAB — DIFFERENTIAL
Abs Immature Granulocytes: 0.12 10*3/uL — ABNORMAL HIGH (ref 0.00–0.07)
Basophils Absolute: 0 10*3/uL (ref 0.0–0.1)
Basophils Relative: 0 %
Eosinophils Absolute: 0.1 10*3/uL (ref 0.0–0.5)
Eosinophils Relative: 1 %
Immature Granulocytes: 1 %
Lymphocytes Relative: 22 %
Lymphs Abs: 2.1 10*3/uL (ref 0.7–4.0)
Monocytes Absolute: 0.9 10*3/uL (ref 0.1–1.0)
Monocytes Relative: 9 %
Neutro Abs: 6.5 10*3/uL (ref 1.7–7.7)
Neutrophils Relative %: 67 %

## 2022-11-03 LAB — CBC
HCT: 32.2 % — ABNORMAL LOW (ref 36.0–46.0)
Hemoglobin: 10.4 g/dL — ABNORMAL LOW (ref 12.0–15.0)
MCH: 27.4 pg (ref 26.0–34.0)
MCHC: 32.3 g/dL (ref 30.0–36.0)
MCV: 84.7 fL (ref 80.0–100.0)
Platelets: 224 10*3/uL (ref 150–400)
RBC: 3.8 MIL/uL — ABNORMAL LOW (ref 3.87–5.11)
RDW: 13.9 % (ref 11.5–15.5)
WBC: 9.8 10*3/uL (ref 4.0–10.5)
nRBC: 0 % (ref 0.0–0.2)

## 2022-11-03 LAB — RAPID HIV SCREEN (HIV 1/2 AB+AG)
HIV 1/2 Antibodies: NONREACTIVE
HIV-1 P24 Antigen - HIV24: NONREACTIVE

## 2022-11-03 LAB — URINALYSIS, ROUTINE W REFLEX MICROSCOPIC
Bilirubin Urine: NEGATIVE
Glucose, UA: NEGATIVE mg/dL
Ketones, ur: NEGATIVE mg/dL
Nitrite: NEGATIVE
Protein, ur: NEGATIVE mg/dL
Specific Gravity, Urine: 1.005 (ref 1.005–1.030)
pH: 6 (ref 5.0–8.0)

## 2022-11-03 LAB — TYPE AND SCREEN
ABO/RH(D): A POS
Antibody Screen: NEGATIVE

## 2022-11-03 LAB — HEMOGLOBIN A1C
Hgb A1c MFr Bld: 5.7 % — ABNORMAL HIGH (ref 4.8–5.6)
Mean Plasma Glucose: 116.89 mg/dL

## 2022-11-03 LAB — HEPATITIS B SURFACE ANTIGEN: Hepatitis B Surface Ag: NONREACTIVE

## 2022-11-03 SURGERY — Surgical Case
Anesthesia: Spinal | Site: Bladder | Laterality: Right

## 2022-11-03 MED ORDER — MEASLES, MUMPS & RUBELLA VAC IJ SOLR: 0.5000 mL | Freq: Once | INTRAMUSCULAR | Status: DC

## 2022-11-03 MED ORDER — CEFAZOLIN SODIUM-DEXTROSE 2-4 GM/100ML-% IV SOLN
2.0000 g | INTRAVENOUS | Status: AC
  Administered 2022-11-03: 2 g via INTRAVENOUS

## 2022-11-03 MED ORDER — MORPHINE SULFATE (PF) 0.5 MG/ML IJ SOLN
INTRAMUSCULAR | Status: AC
  Filled 2022-11-03: qty 10

## 2022-11-03 MED ORDER — ENOXAPARIN SODIUM 40 MG/0.4ML IJ SOSY
40.0000 mg | PREFILLED_SYRINGE | INTRAMUSCULAR | Status: DC
  Filled 2022-11-03 (×2): qty 0.4

## 2022-11-03 MED ORDER — KETOROLAC TROMETHAMINE 30 MG/ML IJ SOLN
INTRAMUSCULAR | Status: AC
  Filled 2022-11-03: qty 1

## 2022-11-03 MED ORDER — AMISULPRIDE (ANTIEMETIC) 5 MG/2ML IV SOLN: 10.0000 mg | Freq: Once | INTRAVENOUS | Status: DC | PRN

## 2022-11-03 MED ORDER — PHENYLEPHRINE HCL-NACL 20-0.9 MG/250ML-% IV SOLN
INTRAVENOUS | Status: DC | PRN
  Administered 2022-11-03: 60 ug/min via INTRAVENOUS

## 2022-11-03 MED ORDER — ONDANSETRON HCL 4 MG/2ML IJ SOLN
INTRAMUSCULAR | Status: AC
  Filled 2022-11-03: qty 2

## 2022-11-03 MED ORDER — CHLORHEXIDINE GLUCONATE 0.12 % MT SOLN
15.0000 mL | Freq: Once | OROMUCOSAL | Status: AC
  Administered 2022-11-03: 15 mL via OROMUCOSAL
  Filled 2022-11-03: qty 15

## 2022-11-03 MED ORDER — OXYCODONE HCL 5 MG PO TABS: 5.0000 mg | ORAL_TABLET | ORAL | Status: DC | PRN

## 2022-11-03 MED ORDER — ONDANSETRON HCL 4 MG/2ML IJ SOLN: 4.0000 mg | Freq: Three times a day (TID) | INTRAMUSCULAR | Status: DC | PRN

## 2022-11-03 MED ORDER — ACETAMINOPHEN 10 MG/ML IV SOLN
INTRAVENOUS | Status: DC | PRN
Start: 2022-11-03 — End: 2022-11-03
  Administered 2022-11-03: 1000 mg via INTRAVENOUS

## 2022-11-03 MED ORDER — SODIUM CHLORIDE 0.9% FLUSH: 3.0000 mL | INTRAVENOUS | Status: DC | PRN

## 2022-11-03 MED ORDER — SODIUM CHLORIDE 0.9 % IR SOLN
Status: DC | PRN
  Administered 2022-11-03: 1

## 2022-11-03 MED ORDER — DIPHENHYDRAMINE HCL 25 MG PO CAPS: 25.0000 mg | ORAL_CAPSULE | ORAL | Status: DC | PRN

## 2022-11-03 MED ORDER — FENTANYL CITRATE (PF) 100 MCG/2ML IJ SOLN
INTRAMUSCULAR | Status: AC
  Filled 2022-11-03: qty 2

## 2022-11-03 MED ORDER — LACTATED RINGERS IV BOLUS
1000.0000 mL | Freq: Once | INTRAVENOUS | Status: AC
  Administered 2022-11-03: 1000 mL via INTRAVENOUS

## 2022-11-03 MED ORDER — OXYTOCIN-SODIUM CHLORIDE 30-0.9 UT/500ML-% IV SOLN
INTRAVENOUS | Status: DC | PRN
  Administered 2022-11-03: 300 mL via INTRAVENOUS

## 2022-11-03 MED ORDER — FENTANYL CITRATE (PF) 100 MCG/2ML IJ SOLN
INTRAMUSCULAR | Status: DC | PRN
  Administered 2022-11-03: 15 ug via INTRATHECAL

## 2022-11-03 MED ORDER — OXYTOCIN-SODIUM CHLORIDE 30-0.9 UT/500ML-% IV SOLN
INTRAVENOUS | Status: AC
  Filled 2022-11-03: qty 500

## 2022-11-03 MED ORDER — ACETAMINOPHEN 10 MG/ML IV SOLN
INTRAVENOUS | Status: AC
  Filled 2022-11-03: qty 100

## 2022-11-03 MED ORDER — DIPHENHYDRAMINE HCL 25 MG PO CAPS: 25.0000 mg | ORAL_CAPSULE | Freq: Four times a day (QID) | ORAL | Status: DC | PRN

## 2022-11-03 MED ORDER — ONDANSETRON HCL 4 MG/2ML IJ SOLN
INTRAMUSCULAR | Status: DC | PRN
  Administered 2022-11-03: 4 mg via INTRAVENOUS

## 2022-11-03 MED ORDER — IBUPROFEN 600 MG PO TABS
600.0000 mg | ORAL_TABLET | Freq: Four times a day (QID) | ORAL | Status: DC
  Administered 2022-11-04 – 2022-11-06 (×5): 600 mg via ORAL
  Filled 2022-11-03 (×8): qty 1

## 2022-11-03 MED ORDER — MAGNESIUM HYDROXIDE 400 MG/5ML PO SUSP: 30.0000 mL | ORAL | Status: DC | PRN

## 2022-11-03 MED ORDER — MEPERIDINE HCL 25 MG/ML IJ SOLN: 6.2500 mg | INTRAMUSCULAR | Status: DC | PRN

## 2022-11-03 MED ORDER — OXYTOCIN-SODIUM CHLORIDE 30-0.9 UT/500ML-% IV SOLN: 2.5000 [IU]/h | INTRAVENOUS | Status: AC

## 2022-11-03 MED ORDER — MEDROXYPROGESTERONE ACETATE 150 MG/ML IM SUSP: 150.0000 mg | INTRAMUSCULAR | Status: DC | PRN

## 2022-11-03 MED ORDER — ZOLPIDEM TARTRATE 5 MG PO TABS: 5.0000 mg | ORAL_TABLET | Freq: Every evening | ORAL | Status: DC | PRN

## 2022-11-03 MED ORDER — ACETAMINOPHEN 500 MG PO TABS
1000.0000 mg | ORAL_TABLET | Freq: Four times a day (QID) | ORAL | Status: DC
  Administered 2022-11-04 – 2022-11-06 (×6): 500 mg via ORAL
  Filled 2022-11-03 (×8): qty 2

## 2022-11-03 MED ORDER — TETANUS-DIPHTH-ACELL PERTUSSIS 5-2.5-18.5 LF-MCG/0.5 IM SUSY: 0.5000 mL | PREFILLED_SYRINGE | Freq: Once | INTRAMUSCULAR | Status: DC

## 2022-11-03 MED ORDER — MORPHINE SULFATE (PF) 0.5 MG/ML IJ SOLN
INTRAMUSCULAR | Status: DC | PRN
  Administered 2022-11-03: .15 mg via INTRATHECAL

## 2022-11-03 MED ORDER — ERYTHROMYCIN 5 MG/GM OP OINT
TOPICAL_OINTMENT | OPHTHALMIC | Status: AC
  Filled 2022-11-03: qty 1

## 2022-11-03 MED ORDER — FAMOTIDINE 20 MG PO TABS
20.0000 mg | ORAL_TABLET | Freq: Once | ORAL | Status: AC
  Administered 2022-11-03: 20 mg via ORAL
  Filled 2022-11-03: qty 1

## 2022-11-03 MED ORDER — ACETAMINOPHEN 500 MG PO TABS: 1000.0000 mg | ORAL_TABLET | Freq: Four times a day (QID) | ORAL | Status: DC

## 2022-11-03 MED ORDER — GABAPENTIN 100 MG PO CAPS: 200.0000 mg | ORAL_CAPSULE | Freq: Every day | ORAL | Status: DC

## 2022-11-03 MED ORDER — NALOXONE HCL 0.4 MG/ML IJ SOLN: 0.4000 mg | INTRAMUSCULAR | Status: DC | PRN

## 2022-11-03 MED ORDER — COCONUT OIL OIL: 1.0000 | TOPICAL_OIL | Status: DC | PRN

## 2022-11-03 MED ORDER — SCOPOLAMINE 1 MG/3DAYS TD PT72: 1.0000 | MEDICATED_PATCH | Freq: Once | TRANSDERMAL | Status: DC

## 2022-11-03 MED ORDER — WITCH HAZEL-GLYCERIN EX PADS: 1.0000 | MEDICATED_PAD | CUTANEOUS | Status: DC | PRN

## 2022-11-03 MED ORDER — SCOPOLAMINE 1 MG/3DAYS TD PT72
1.0000 | MEDICATED_PATCH | Freq: Once | TRANSDERMAL | Status: DC
  Administered 2022-11-03: 1.5 mg via TRANSDERMAL
  Filled 2022-11-03: qty 1

## 2022-11-03 MED ORDER — ORAL CARE MOUTH RINSE: 15.0000 mL | Freq: Once | OROMUCOSAL | Status: AC

## 2022-11-03 MED ORDER — SOD CITRATE-CITRIC ACID 500-334 MG/5ML PO SOLN
30.0000 mL | Freq: Once | ORAL | Status: AC
  Administered 2022-11-03: 30 mL via ORAL
  Filled 2022-11-03: qty 30

## 2022-11-03 MED ORDER — BUPIVACAINE IN DEXTROSE 0.75-8.25 % IT SOLN
INTRATHECAL | Status: DC | PRN
  Administered 2022-11-03: 1.7 mL via INTRATHECAL

## 2022-11-03 MED ORDER — KETOROLAC TROMETHAMINE 30 MG/ML IJ SOLN: 30.0000 mg | Freq: Four times a day (QID) | INTRAMUSCULAR | Status: DC | PRN

## 2022-11-03 MED ORDER — LACTATED RINGERS IV SOLN: INTRAVENOUS | Status: DC

## 2022-11-03 MED ORDER — SIMETHICONE 80 MG PO CHEW
80.0000 mg | CHEWABLE_TABLET | Freq: Three times a day (TID) | ORAL | Status: DC
  Administered 2022-11-04 – 2022-11-06 (×6): 80 mg via ORAL
  Filled 2022-11-03 (×6): qty 1

## 2022-11-03 MED ORDER — SIMETHICONE 80 MG PO CHEW
80.0000 mg | CHEWABLE_TABLET | ORAL | Status: DC | PRN
  Administered 2022-11-05: 80 mg via ORAL
  Filled 2022-11-03: qty 1

## 2022-11-03 MED ORDER — ONDANSETRON HCL 4 MG/2ML IJ SOLN: 4.0000 mg | Freq: Once | INTRAMUSCULAR | Status: DC | PRN

## 2022-11-03 MED ORDER — KETOROLAC TROMETHAMINE 30 MG/ML IJ SOLN: 30.0000 mg | Freq: Four times a day (QID) | INTRAMUSCULAR | Status: AC

## 2022-11-03 MED ORDER — NALOXONE HCL 4 MG/10ML IJ SOLN: 1.0000 ug/kg/h | INTRAVENOUS | Status: DC | PRN

## 2022-11-03 MED ORDER — DIPHENHYDRAMINE HCL 50 MG/ML IJ SOLN: 12.5000 mg | INTRAMUSCULAR | Status: DC | PRN

## 2022-11-03 MED ORDER — DIBUCAINE (PERIANAL) 1 % EX OINT: 1.0000 | TOPICAL_OINTMENT | CUTANEOUS | Status: DC | PRN

## 2022-11-03 MED ORDER — PHENYLEPHRINE HCL-NACL 20-0.9 MG/250ML-% IV SOLN
INTRAVENOUS | Status: AC
  Filled 2022-11-03: qty 250

## 2022-11-03 MED ORDER — DEXAMETHASONE SODIUM PHOSPHATE 10 MG/ML IJ SOLN
INTRAMUSCULAR | Status: DC | PRN
Start: 2022-11-03 — End: 2022-11-03
  Administered 2022-11-03: 10 mg via INTRAVENOUS

## 2022-11-03 MED ORDER — SENNOSIDES-DOCUSATE SODIUM 8.6-50 MG PO TABS
2.0000 | ORAL_TABLET | Freq: Every day | ORAL | Status: DC
  Administered 2022-11-04 – 2022-11-05 (×2): 2 via ORAL
  Filled 2022-11-03 (×3): qty 2

## 2022-11-03 MED ORDER — MENTHOL 3 MG MT LOZG: 1.0000 | LOZENGE | OROMUCOSAL | Status: DC | PRN

## 2022-11-03 MED ORDER — FENTANYL CITRATE (PF) 100 MCG/2ML IJ SOLN: 25.0000 ug | INTRAMUSCULAR | Status: DC | PRN

## 2022-11-03 MED ORDER — VITAMIN K1 1 MG/0.5ML IJ SOLN
INTRAMUSCULAR | Status: AC
  Filled 2022-11-03: qty 0.5

## 2022-11-03 MED ORDER — PRENATAL MULTIVITAMIN CH
1.0000 | ORAL_TABLET | Freq: Every day | ORAL | Status: DC
  Filled 2022-11-03 (×2): qty 1

## 2022-11-03 SURGICAL SUPPLY — 48 items
ADH SKN CLS APL DERMABOND .7 (GAUZE/BANDAGES/DRESSINGS)
APL PRP STRL LF DISP 70% ISPRP (MISCELLANEOUS) ×6
APL SKNCLS STERI-STRIP NONHPOA (GAUZE/BANDAGES/DRESSINGS) ×3
BENZOIN TINCTURE PRP APPL 2/3 (GAUZE/BANDAGES/DRESSINGS) IMPLANT
CHLORAPREP W/TINT 26 (MISCELLANEOUS) ×6 IMPLANT
CLAMP UMBILICAL CORD (MISCELLANEOUS) ×3 IMPLANT
CLOTH BEACON ORANGE TIMEOUT ST (SAFETY) ×3 IMPLANT
DERMABOND ADVANCED .7 DNX12 (GAUZE/BANDAGES/DRESSINGS) ×6 IMPLANT
DRSG OPSITE POSTOP 4X10 (GAUZE/BANDAGES/DRESSINGS) ×3 IMPLANT
ELECT REM PT RETURN 9FT ADLT (ELECTROSURGICAL) ×3 IMPLANT
ELECTRODE REM PT RTRN 9FT ADLT (ELECTROSURGICAL) ×3 IMPLANT
EXTRACTOR VACUUM M CUP 4 TUBE (SUCTIONS) IMPLANT
GAUZE PAD ABD 7.5X8 STRL (GAUZE/BANDAGES/DRESSINGS) IMPLANT
GAUZE SPONGE 4X4 12PLY STRL LF (GAUZE/BANDAGES/DRESSINGS) IMPLANT
GLOVE BIOGEL PI IND STRL 7.0 (GLOVE) ×6 IMPLANT
GLOVE BIOGEL PI IND STRL 7.5 (GLOVE) ×6 IMPLANT
GLOVE ECLIPSE 7.5 STRL STRAW (GLOVE) ×3 IMPLANT
GOWN STRL REUS W/TWL LRG LVL3 (GOWN DISPOSABLE) ×9 IMPLANT
HEMOSTAT ARISTA ABSORB 3G PWDR (HEMOSTASIS) IMPLANT
KIT ABG SYR 3ML LUER SLIP (SYRINGE) IMPLANT
LIGASURE IMPACT 36 18CM CVD LR (INSTRUMENTS) IMPLANT
NDL HYPO 18GX1.5 BLUNT FILL (NEEDLE) IMPLANT
NDL HYPO 25X5/8 SAFETYGLIDE (NEEDLE) IMPLANT
NEEDLE HYPO 18GX1.5 BLUNT FILL (NEEDLE) ×3 IMPLANT
NEEDLE HYPO 25X5/8 SAFETYGLIDE (NEEDLE) IMPLANT
NS IRRIG 1000ML POUR BTL (IV SOLUTION) ×3 IMPLANT
PACK C SECTION WH (CUSTOM PROCEDURE TRAY) ×3 IMPLANT
PAD OB MATERNITY 4.3X12.25 (PERSONAL CARE ITEMS) ×3 IMPLANT
RTRCTR C-SECT PINK 25CM LRG (MISCELLANEOUS) ×3 IMPLANT
SPONGE T-LAP 18X18 ~~LOC~~+RFID (SPONGE) IMPLANT
STRIP CLOSURE SKIN 1/2X4 (GAUZE/BANDAGES/DRESSINGS) IMPLANT
SUT MNCRL 0 VIOLET CTX 36 (SUTURE) ×6 IMPLANT
SUT MNCRL AB 3-0 PS2 27 (SUTURE) ×3 IMPLANT
SUT MNCRL AB 4-0 PS2 18 (SUTURE) ×3 IMPLANT
SUT PLAIN 2 0 (SUTURE) ×6
SUT PLAIN ABS 2-0 CT1 27XMFL (SUTURE) IMPLANT
SUT VIC AB 0 CT1 27 (SUTURE) ×6
SUT VIC AB 0 CT1 27XBRD ANBCTR (SUTURE) ×3 IMPLANT
SUT VIC AB 0 CTX 36 (SUTURE) ×3
SUT VIC AB 0 CTX36XBRD ANBCTRL (SUTURE) ×3 IMPLANT
SUT VIC AB 2-0 CT1 27 (SUTURE) ×3
SUT VIC AB 2-0 CT1 TAPERPNT 27 (SUTURE) ×3 IMPLANT
SUT VIC AB 4-0 KS 27 (SUTURE) IMPLANT
SUT VIC AB 4-0 PS2 27 (SUTURE) ×3 IMPLANT
SYR 30ML LL (SYRINGE) IMPLANT
TOWEL OR 17X24 6PK STRL BLUE (TOWEL DISPOSABLE) ×3 IMPLANT
TRAY FOLEY W/BAG SLVR 14FR LF (SET/KITS/TRAYS/PACK) ×3 IMPLANT
WATER STERILE IRR 1000ML POUR (IV SOLUTION) ×3 IMPLANT

## 2022-11-03 NOTE — Anesthesia Preprocedure Evaluation (Addendum)
Anesthesia Evaluation  Patient identified by MRN, date of birth, ID band Patient awake  General Assessment Comment:32 y.o. female [phone] with IUP at [redacted]w[redacted]d  Reviewed: Allergy & Precautions, NPO status , Patient's Chart, lab work & pertinent test results  Airway Mallampati: II  TM Distance: >3 FB Neck ROM: Full    Dental  (+) Dental Advisory Given, Upper Dentures, Edentulous Lower   Pulmonary former smoker   Pulmonary exam normal breath sounds clear to auscultation       Cardiovascular negative cardio ROS Normal cardiovascular exam Rhythm:Regular Rate:Normal     Neuro/Psych  PSYCHIATRIC DISORDERS Anxiety     negative neurological ROS     GI/Hepatic negative GI ROS, Neg liver ROS,,,  Endo/Other  negative endocrine ROS    Renal/GU negative Renal ROS     Musculoskeletal negative musculoskeletal ROS (+)    Abdominal   Peds  Hematology negative hematology ROS (+)   Anesthesia Other Findings   Reproductive/Obstetrics (+) Pregnancy H/o C-section x3                             Anesthesia Physical Anesthesia Plan  ASA: 2 and emergent  Anesthesia Plan: Combined Spinal and Epidural   Post-op Pain Management:    Induction: Intravenous  PONV Risk Score and Plan: 2 and Treatment may vary due to age or medical condition, Scopolamine patch - Pre-op, Dexamethasone and Ondansetron  Airway Management Planned: Natural Airway  Additional Equipment:   Intra-op Plan:   Post-operative Plan:   Informed Consent: I have reviewed the patients History and Physical, chart, labs and discussed the procedure including the risks, benefits and alternatives for the proposed anesthesia with the patient or authorized representative who has indicated his/her understanding and acceptance.     Dental advisory given  Plan Discussed with: CRNA  Anesthesia Plan Comments:         Anesthesia Quick  Evaluation

## 2022-11-03 NOTE — Anesthesia Procedure Notes (Signed)
Spinal  Patient location during procedure: OR Start time: 11/03/2022 6:40 PM End time: 11/03/2022 6:50 PM Staffing Performed: anesthesiologist  Anesthesiologist: Mal Amabile, MD Performed by: Mal Amabile, MD Authorized by: Mal Amabile, MD   Preanesthetic Checklist Completed: patient identified, IV checked, site marked, risks and benefits discussed, surgical consent, monitors and equipment checked, pre-op evaluation and timeout performed Spinal Block Patient position: sitting Prep: DuraPrep and site prepped and draped Patient monitoring: cardiac monitor, continuous pulse ox, blood pressure and heart rate Approach: midline Location: L3-4 Injection technique: catheter Needle Needle type: Tuohy and Spinocan  Needle gauge: 24 G Needle length: 12.7 cm Needle insertion depth: 5 cm Catheter type: closed end flexible Catheter size: 19 g Assessment Sensory level: T4 Events: CSF return Additional Notes Epidural performed using LOR with air technique. No CSF, Heme or paresthesias. SAB performed through the epidural needle using 24ga Spinocan needle. CSF clear with free flow and no paresthesias. Local anesthetic and narcotics injected through the spinal needle and withdrawn. Epidural catheter threaded 5cm into the epidural space and the epidural needle was withdrawn. A sterile dressing was applied and the patient placed supine with LUD. The patient tolerated the procedure well and adequate sensory level was obtained.

## 2022-11-03 NOTE — Transfer of Care (Addendum)
Immediate Anesthesia Transfer of Care Note  Patient: Tracy Cooper  Procedure(s) Performed: CESAREAN SECTION UNILATERAL SALPINGECTOMY (Right) Bladder Irrigation with Foley Insertion (Bladder)  Patient Location: PACU  Anesthesia Type:Spinal and Epidural  Level of Consciousness: awake, alert , and oriented  Airway & Oxygen Therapy: Patient Spontanous Breathing  Post-op Assessment: Report given to RN and Post -op Vital signs reviewed and stable  Post vital signs: stable  Last Vitals:  Vitals Value Taken Time  BP 103/60 11/03/22 2100  Temp    Pulse 53 11/03/22 2100  Resp 19 11/03/22 2100  SpO2 93 % 11/03/22 2100  Vitals shown include unfiled device data.  Last Pain:  Vitals:   11/03/22 1329  TempSrc:   PainSc: 8          Complications: No notable events documented.

## 2022-11-03 NOTE — Progress Notes (Addendum)
To OR via stretcher in stable condition.  OBOR called as leaving room, requested to wait to transfer to pt to OR.

## 2022-11-03 NOTE — H&P (Signed)
Obstetric Preoperative History and Physical  Tracy Cooper is a 32 y.o. [phone] with IUP at [redacted]w[redacted]d presenting for presenting with vaginal pressure and pain over her incision. Patient with prior CS x3.  No acute concerns.   Prenatal Course Source of Care: No [facility] Pregnancy complications or risks: Patient Active Problem List   Diagnosis Date Noted   Hemoperitoneum due to rupture of left tubal ectopic pregnancy 09/12/2019   She plans to breastfeed She desires Right tubal ligation (previously had Left removed for ruptured ectopic in 2022) for postpartum contraception.   Prenatal labs and studies: ABO, Rh: --/--/PENDING (09/19 1600)  Antibody: PENDING (09/19 1600) Rubella:   RPR:    HBsAg:    HIV:    GBS:  1 hr Glucola  -- has not had  Prenatal Transfer Tool  Maternal Diabetes: No- not screened Genetic Screening: None performed Maternal Ultrasounds/Referrals: Normal- limited US in MAU today. Did not have a full anatomy.  Fetal Ultrasounds or other Referrals:  None Maternal Substance Abuse:  No Significant Maternal Medications:  Meds include: Other: Lexapro 5mg  Number of Prenatal Visits:Less than or equal to 3 verified prenatal visits-- No prenatal care.  Other Comments:  No prenatal care, agoraphobia during this pregnancy.  Past Medical History:  Diagnosis Date   Anxiety     Past Surgical History:  Procedure Laterality Date   CESAREAN SECTION     x 3   DIAGNOSTIC LAPAROSCOPY WITH REMOVAL OF ECTOPIC PREGNANCY N/A 08/19/2019   Procedure: DIAGNOSTIC LAPAROSCOPY WITH Left Salpingectomy and  REMOVAL OF ECTOPIC PREGNANCY;  Surgeon: Adam Phenix, MD;  Location: [facility] OR;  Service: Gynecology;  Laterality: N/A;    OB History  Gravida Para Term Preterm AB Living  7 3 3  0 3 3  SAB IAB Ectopic Multiple Live Births  2   1   3     # Outcome Date GA Lbr Len/2nd Weight Sex Type Anes PTL Lv  7 Current           6 Term 12/18/17 [redacted]w[redacted]d  4252 g M CS-LTranv   LIV  5 Term 02/11/12  [redacted]w[redacted]d  3232 g M CS-LTranv  Y LIV     Complications: Breech malpresentation successfully converted to cephalic presentation  4 Term 45/40/98   3317 g M CS-LTranv   LIV  3 SAB           2 SAB           1 Ectopic             Social History   Socioeconomic History   Marital status: Married    Spouse name: Not on file   Number of children: Not on file   Years of education: Not on file   Highest education level: Not on file  Occupational History   Not on file  Tobacco Use   Smoking status: Former    Types: Cigarettes   Smokeless tobacco: Never  Vaping Use   Vaping status: Former   Substances: Nicotine, Flavoring  Substance and Sexual Activity   Alcohol use: Never   Drug use: Never   Sexual activity: Not Currently  Other Topics Concern   Not on file  Social History Narrative   Not on file   Social Determinants of Health   Financial Resource Strain: Not on file  Food Insecurity: No Food Insecurity (09/12/2019)   Hunger Vital Sign    Worried About Running Out of Food in the Last Year: Never true  Ran Out of Food in the Last Year: Never true  Transportation Needs: No Transportation Needs (09/12/2019)   PRAPARE - Administrator, Civil Service (Medical): No    Lack of Transportation (Non-Medical): No  Physical Activity: Not on file  Stress: Not on file  Social Connections: Not on file    Family History  Problem Relation Age of Onset   COPD Mother     Medications Prior to Admission  Medication Sig Dispense Refill Last Dose   escitalopram (LEXAPRO) 5 MG tablet Take 5 mg by mouth daily.   11/02/2022   potassium chloride SA (KLOR-CON) 20 MEQ tablet Take 1 tablet (20 mEq total) by mouth daily. 7 tablet 0     No Known Allergies  Review of Systems: Negative except for what is mentioned in HPI.  Physical Exam: BP 134/73 (BP Location: Right Arm)   Pulse 87   Temp 98.3 F (36.8 C) (Oral)   Resp 18   Ht 5\' 2"  (1.575 m)   Wt 72.4 kg   LMP 02/01/2022 (Exact  Date)   SpO2 99%   BMI 29.19 kg/m   CONSTITUTIONAL: Well-developed, well-nourished female in no acute distress.  HENT:  Normocephalic, atraumatic, External right and left ear normal. Oropharynx is clear and moist EYES: Conjunctivae and EOM are normal. Pupils are equal, round, and reactive to light. No scleral icterus.  NECK: Normal range of motion, supple, no masses SKIN: Skin is warm and dry. No rash noted. Not diaphoretic. No erythema. No pallor. NEUROLGIC: Alert and oriented to person, place, and time. Normal reflexes, muscle tone coordination. No cranial nerve deficit noted. PSYCHIATRIC: Normal mood and affect. Normal behavior. Normal judgment and thought content. CARDIOVASCULAR: Normal heart rate noted, regular rhythm RESPIRATORY: Effort and breath sounds normal, no problems with respiration noted ABDOMEN: Soft, nontender, nondistended, gravid. Well-healed Pfannenstiel incision. PELVIC: Deferred MUSCULOSKELETAL: Normal range of motion. No edema and no tenderness. 2+ distal pulses.  NST: 130/moderate/+accels, no decels Toco: q 10 min ctx  Pertinent Labs/Studies:   Results for orders placed or performed during the hospital encounter of 11/03/22 (from the past 72 hour(s))  Urinalysis, Routine w reflex microscopic -Urine, Clean Catch     Status: Abnormal   Collection Time: 11/03/22  1:31 PM  Result Value Ref Range   Color, Urine STRAW (A) YELLOW   APPearance CLEAR CLEAR   Specific Gravity, Urine 1.005 1.005 - 1.030   pH 6.0 5.0 - 8.0   Glucose, UA NEGATIVE NEGATIVE mg/dL   Hgb urine dipstick SMALL (A) NEGATIVE   Bilirubin Urine NEGATIVE NEGATIVE   Ketones, ur NEGATIVE NEGATIVE mg/dL   Protein, ur NEGATIVE NEGATIVE mg/dL   Nitrite NEGATIVE NEGATIVE   Leukocytes,Ua MODERATE (A) NEGATIVE   RBC / HPF 0-5 0 - 5 RBC/hpf   WBC, UA 0-5 0 - 5 WBC/hpf   Bacteria, UA RARE (A) NONE SEEN   Squamous Epithelial / HPF 11-20 0 - 5 /HPF    Comment: Performed at [facility], [address], [place], [place] [address]  Type and screen [facility]     Status: None (Preliminary result)   Collection Time: 11/03/22  4:00 PM  Result Value Ref Range   ABO/RH(D) PENDING    Antibody Screen PENDING    Sample Expiration      11/06/2022,2359 Performed at [facility], [address] [place], [place] [address]     Assessment and Plan :Shuana Bogucki is a 32 y.o.  [id number] at [redacted]w[redacted]d being admitted being admitted for scheduled cesarean section. The risks of cesarean section discussed with the patient included but were not limited to: bleeding which may require transfusion or reoperation; infection which may require antibiotics; injury to bowel, bladder, ureters or other surrounding organs; injury to the fetus; need for additional procedures including hysterectomy in the event of a life-threatening hemorrhage; placental abnormalities wth subsequent pregnancies, incisional problems, thromboembolic phenomenon and other postoperative/anesthesia complications. The patient concurred with the proposed plan, giving informed written consent for the procedure. Patient has been NPO since last night she will remain NPO for procedure. Anesthesia and OR aware. Preoperative prophylactic antibiotics and SCDs ordered on call to the OR. To OR when ready.   Last ate 11/02/22 prior to midnight  Desires salpingectomy-- previously had left salpingectomy.  We discussed method of sterilization with the preferred method being salpingectomy. Risks of procedure discussed with patient including but not limited to: risk of regret, permanence of method, bleeding, infection, injury to surrounding organs and need for additional procedures.  Failure risk of 0.5-1% with increased risk of ectopic gestation if pregnancy occurs was also discussed with patient.    Posted for 17:30 with Dr Harlin Heys, MD    Federico Flake, MD, MPH, ABFM Attending Physician Center for  [facility]

## 2022-11-03 NOTE — MAU Note (Signed)
Tracy Cooper is a 32 y.o. at Unknown here in MAU reporting: she's had pelvic pressure for the past 3 days.  Denies VB or LOF.  Endorses +FM. No PNC with this pregnancy secondary no insurance.  Reports Hx Cesarean section x3. LMP: 02/02/2023 Onset of complaint: Monday Pain score: 8 Vitals:   11/03/22 1327  BP: 134/73  Pulse: 87  Resp: 18  Temp: 98.3 F (36.8 C)  SpO2: 100%     FHT:131 bpm Lab orders placed from triage:   UA

## 2022-11-03 NOTE — Anesthesia Postprocedure Evaluation (Signed)
Anesthesia Post Note  Patient: Tracy Cooper  Procedure(s) Performed: CESAREAN SECTION UNILATERAL SALPINGECTOMY (Right) Bladder Irrigation with Foley Insertion (Bladder)     Patient location during evaluation: PACU Anesthesia Type: Spinal Level of consciousness: oriented and awake and alert Pain management: pain level controlled Vital Signs Assessment: post-procedure vital signs reviewed and stable Respiratory status: spontaneous breathing, respiratory function stable and nonlabored ventilation Cardiovascular status: blood pressure returned to baseline and stable Postop Assessment: no headache, no backache, no apparent nausea or vomiting, spinal receding and patient able to bend at knees Anesthetic complications: no   No notable events documented.  Last Vitals:  Vitals:   11/03/22 2145 11/03/22 2200  BP:    Pulse: (!) 49 (!) 59  Resp: 13 18  Temp: 37.2 C   SpO2: 96% 99%    Last Pain:  Vitals:   11/03/22 2200  TempSrc:   PainSc: 0-No pain   Pain Goal:    LLE Motor Response: Purposeful movement (11/03/22 2200) LLE Sensation: Tingling, Full sensation, Increased (11/03/22 2200) RLE Motor Response: Purposeful movement (11/03/22 2200) RLE Sensation: Tingling, Full sensation, Increased (11/03/22 2200)     Epidural/Spinal Function Cutaneous sensation: Able to Wiggle Toes (11/03/22 2200), Patient able to flex knees: Yes (11/03/22 2200), Patient able to lift hips off bed: Yes (11/03/22 2200), Back pain beyond tenderness at insertion site: No (11/03/22 2200), Progressively worsening motor and/or sensory loss: No (11/03/22 2200), Bowel and/or bladder incontinence post epidural: No (11/03/22 2200)  Baylen Dea A.

## 2022-11-03 NOTE — Progress Notes (Signed)
Pt to OR vis stretcher in stable condition.

## 2022-11-03 NOTE — Brief Op Note (Signed)
11/03/2022  8:52 PM  PATIENT:  Tracy Cooper  32 y.o. female  PRE-OPERATIVE DIAGNOSIS:  previous cesarean x 3;desires sterilization  POST-OPERATIVE DIAGNOSIS:  previous cesarean x 3;desires sterilization  PROCEDURE:  Procedure(s): CESAREAN SECTION (N/A) UNILATERAL SALPINGECTOMY (Right) Bladder Irrigation with Foley Insertion (N/A)  SURGEON:  Surgeons and Role:    * Lennart Pall, MD - Primary    * Hessie Dibble, MD - Fellow  ASSISTANTS: Dr. Mittie Bodo   ANESTHESIA:   epidural and spinal  EBL:  BLOOD ADMINISTERED:none  DRAINS: none   LOCAL MEDICATIONS USED:  NONE  SPECIMEN:  Right fallopian tube, portion of left fallopian tube  DISPOSITION OF SPECIMEN:  PATHOLOGY  COUNTS:  YES  TOURNIQUET:  * No tourniquets in log *  DICTATION: .Note written in EPIC  PLAN OF CARE: Admit to inpatient   PATIENT DISPOSITION:  PACU - hemodynamically stable.   Delay start of Pharmacological VTE agent (>24hrs) due to surgical blood loss or risk of bleeding: not applicable

## 2022-11-04 ENCOUNTER — Encounter (HOSPITAL_COMMUNITY): Payer: Self-pay | Admitting: Obstetrics and Gynecology

## 2022-11-04 DIAGNOSIS — Z9079 Acquired absence of other genital organ(s): Secondary | ICD-10-CM

## 2022-11-04 DIAGNOSIS — O0930 Supervision of pregnancy with insufficient antenatal care, unspecified trimester: Secondary | ICD-10-CM

## 2022-11-04 DIAGNOSIS — F419 Anxiety disorder, unspecified: Secondary | ICD-10-CM | POA: Diagnosis present

## 2022-11-04 LAB — CREATININE, SERUM
Creatinine, Ser: 0.6 mg/dL (ref 0.44–1.00)
GFR, Estimated: >60 mL/min (ref >60)

## 2022-11-04 LAB — RUBELLA SCREEN: Rubella: <0.9 index — ABNORMAL LOW (ref >0.99)

## 2022-11-04 LAB — CBC
HCT: 28.6 % — ABNORMAL LOW (ref 36.0–46.0)
Hemoglobin: 9.2 g/dL — ABNORMAL LOW (ref 12.0–15.0)
MCH: 27.1 pg (ref 26.0–34.0)
MCHC: 32.2 g/dL (ref 30.0–36.0)
MCV: 84.4 fL (ref 80.0–100.0)
Platelets: 218 10*3/uL (ref 150–400)
RBC: 3.39 MIL/uL — ABNORMAL LOW (ref 3.87–5.11)
RDW: 14 % (ref 11.5–15.5)
WBC: 12.7 10*3/uL — ABNORMAL HIGH (ref 4.0–10.5)
nRBC: 0 % (ref 0.0–0.2)

## 2022-11-04 LAB — RPR: RPR Ser Ql: NONREACTIVE

## 2022-11-04 MED ORDER — FERROUS SULFATE 325 (65 FE) MG PO TABS
325.0000 mg | ORAL_TABLET | ORAL | Status: DC
  Administered 2022-11-04 – 2022-11-06 (×2): 325 mg via ORAL
  Filled 2022-11-04 (×2): qty 1

## 2022-11-04 NOTE — Clinical Social Work Maternal (Signed)
CLINICAL SOCIAL WORK MATERNAL/CHILD NOTE  Patient Details  Name: Tracy Cooper MRN: [id number] Date of Birth: August 26, 1990  Date:  11/04/2022  Clinical Social Worker Initiating Note:  Willaim Rayas Skylene Deremer Date/Time: Initiated:  11/04/22/1325     Child's Name:      Biological Parents:  Mother, Father Michel Tilley [birth year] 1992, Renato Shin 06/08/1988)   Need for Interpreter:  None   Reason for Referral:  Late or No Prenatal Care     Address:  [address] [place] [address]    Phone number:  [phone] (home)     Additional phone number:   Household Members/Support Persons (HM/SP):   Household Member/Support Person 1, Household Member/Support Person 2, Household Member/Support Person 3   HM/SP Name Relationship DOB or Age  HM/SP -1 Suzan Garibaldi Schorer son 02/11/2012  HM/SP -2 Maxton Staley son 12/18/2017  HM/SP -3 Renato Shin spouse 06/08/1988  HM/SP -4        HM/SP -5        HM/SP -6        HM/SP -7        HM/SP -8          Natural Supports (not living in the home):      Professional Supports: None   Employment: Unemployed   Type of Work:     Education:  Counsellor arranged:    Architect:  Media planner , [employer]   Other Resources:  Sales executive     Cultural/Religious Considerations Which May Impact Care:    Strengths:  Ability to meet basic needs  , Home prepared for child  , Pediatrician chosen   Psychotropic Medications:         Pediatrician:    Armed forces operational officer area  Pediatrician List:   [facility]  [employer]    [facility]      Pediatrician Fax Number:    Risk Factors/Current Problems:  None   Cognitive State:  Able to Concentrate  , Alert     Mood/Affect:  Calm  , Comfortable     CSW Assessment: CSW received consult for no prenatal care and hx anxiety, CSW met with MOB to complete assessment and offer resources. CSW  entered the room and observed MOB relaxing in the recliner and FOB at bedside. CSW introduced self, CSW role and reason for visit MOB was agreeable to visit and allowed FOB to remain in the room. CSW inquired about how MOB was feeling, MOB reported feeling tired . CSW confirmed MOB's address and phone number, MOB verified the address and number on file were correct.   CSW inquired about MOB MH hx, MOB reported she was diagnosed with anxiety as a teenager and diagnosed in the last year with panic disorder. MOB reported she currently is prescribed Lexapro. MOB reported the medication has been beneficial for her. CSW assessed for safety, MOB denied nay SI or HI. CSW provided education regarding the baby blues period vs. perinatal mood disorders, discussed treatment and gave resources for mental health follow up if concerns arise.  CSW recommends self-evaluation during the postpartum time period using the New Mom Checklist from Postpartum Progress and encouraged MOB to contact a medical professional if symptoms are noted at any time. MOB identified FOB as her support.   CSW inquired about MOB lack of prenatal care, MOB reported she had trouble getting her recorded transferred and once they  were here she was too far along in her pregnancy and no one would accept her, MOB report she was also dealing with sever anxiety and agoraphobia which made if hard for her to leave the house. CSW informed MOB of the hospital drug screen policy due to lack of prenatal care. MOB verbalized understanding.   CSW provided review of Sudden Infant Death Syndrome (SIDS) precautions.  MOB reported they have all necessary items for the infant including a bassinet and car seat.  CSW identifies no further need for intervention and no barriers to discharge at this time.  CSW Plan/Description:  No Further Intervention Required/No Barriers to Discharge, Sudden Infant Death Syndrome (SIDS) Education, Perinatal Mood and Anxiety Disorder  (PMADs) Education, Hospital Drug Screen Policy Information, CSW Will Continue to Monitor Umbilical Cord Tissue Drug Screen Results and Make Report if Sandy Salaam, LCSW 11/04/2022, 1:33 PM

## 2022-11-04 NOTE — Lactation Note (Signed)
This note was copied from a baby's chart. Lactation Consultation Note  Patient Name: Tracy Cooper [id number] Date: 11/04/2022 Age:31 hours Reason for consult: Initial assessment;Term  P1- MOB states that infant latches very well and has been the best breast feeder so far out of all of her children. MOB will not be going to work, so she plans on feeding from the breast as long as possible and then adding in pumping months later. MOB states she has history of a large milk supply with no complications.  LC encouraged MOB to call [facility] team while in the hospital to assess a latch. LC reviewed LC services handout, CDC pumping guidelines, and feeding infant 8-12x in 24 hrs.  Maternal Data Does the patient have breastfeeding experience prior to this delivery?: Yes How long did the patient breastfeed?: Has fed three other children for multiple long lengths of time.  Feeding Mother's Current Feeding Choice: Breast Milk  Interventions Interventions: Breast feeding basics reviewed;Education;Guidelines for Milk Supply and Pumping Schedule Handout;LC Services brochure  Discharge Pump: Hands Free;Personal (MOB plans on getting a DEBP as well from her insurance once discharged. MOB states that she is in no rush to pump.)  Consult Status Consult Status: Follow-up Date: 11/05/22 Follow-up type: In-patient    Dema Severin BS, IBCLC 11/04/2022, 8:47 AM

## 2022-11-04 NOTE — Discharge Summary (Signed)
Postpartum Discharge Summary  Date of Service updated***     Patient Name: Tracy Cooper DOB: 23-Jun-1990 MRN: [id number]  Date of admission: 11/03/2022 Delivery date:11/03/2022 Delivering provider: Lennart Pall Date of discharge: 11/05/2022  Admitting diagnosis: S/P cesarean section [Z98.891] Intrauterine pregnancy: [redacted]w[redacted]d     Secondary diagnosis:  Principal Problem:   S/P cesarean section Active Problems:   No prenatal care in current pregnancy   Anxiety   Status post bilateral salpingectomy  Additional problems: none    Discharge diagnosis: Term Pregnancy Delivered                                              Post partum procedures: None Augmentation: N/A Complications: None  Hospital course: Sceduled C/S   32 y.o. yo [id number] at [redacted]w[redacted]d was admitted to the hospital 11/03/2022 for elective repeat cesarean section with the following indication:Elective Repeat - 3 prior sections. Delivery details are as follows:  Membrane Rupture Time/Date: 7:20 PM,11/03/2022  Delivery Method:C-Section, Low Transverse Operative Delivery:N/A Details of operation can be found in separate operative note.  Patient had a postpartum course complicated by nothing.  She is ambulating, tolerating a regular diet, passing flatus, and urinating well. Patient is discharged home in stable condition on  11/05/22        Newborn Data: Birth date:11/03/2022 Birth time:7:21 PM Gender:Female Living status:Living Apgars:8 ,8  Weight:3110 g    Magnesium Sulfate received: No BMZ received: No Rhophylac:N/A MMR:Yes - rubella pending T-DaP:    - offered Flu: N/A - offered RSV Vaccine received: No Transfusion:No  Immunizations received: There is no immunization history for the selected administration types on file for this patient.  Physical exam  Vitals:   11/04/22 0930 11/04/22 1300 11/04/22 2112 11/05/22 0512  BP:  (!) 106/56 114/74 114/65  Pulse:  (!) 52 67 (!) 58  Resp: 18 16 17 14    Temp: 98.4 F (36.9 C) 97.9 F (36.6 C) 98.5 F (36.9 C) 97.7 F (36.5 C)  TempSrc: Oral Oral Oral Oral  SpO2: 97% 99% 100% 99%  Weight:      Height:       General: alert, cooperative, and no distress Lochia: appropriate Uterine Fundus: firm Incision: Healing well with no significant drainage DVT Evaluation: No evidence of DVT seen on physical exam. Labs: Lab Results  Component Value Date   WBC 12.7 (H) 11/04/2022   HGB 9.2 (L) 11/04/2022   HCT 28.6 (L) 11/04/2022   MCV 84.4 11/04/2022   PLT 218 11/04/2022      Latest Ref Rng & Units 11/04/2022    4:56 AM  CMP  Creatinine 0.44 - 1.00 mg/dL 5.36    Edinburgh Score:    11/04/2022    8:30 PM  Edinburgh Postnatal Depression Scale Screening Tool  I have been able to laugh and see the funny side of things. 0  I have looked forward with enjoyment to things. 0  I have blamed myself unnecessarily when things went wrong. 2  I have been anxious or worried for no good reason. 2  I have felt scared or panicky for no good reason. 0  Things have been getting on top of me. 1  I have been so unhappy that I have had difficulty sleeping. 0  I have felt sad or miserable. 0  I have been so unhappy that I  have been crying. 0  The thought of harming myself has occurred to me. 0  Edinburgh Postnatal Depression Scale Total 5   Edinburgh Postnatal Depression Scale Total: 5   After visit meds:  Allergies as of 11/05/2022   No Known Allergies      Medication List     TAKE these medications    acetaminophen 500 MG tablet Commonly known as: TYLENOL Take 2 tablets (1,000 mg total) by mouth every 6 (six) hours.   escitalopram 5 MG tablet Commonly known as: LEXAPRO Take 5 mg by mouth daily.   ibuprofen 600 MG tablet Commonly known as: ADVIL Take 1 tablet (600 mg total) by mouth every 6 (six) hours.   oxyCODONE 5 MG immediate release tablet Commonly known as: Oxy IR/ROXICODONE Take 1-2 tablets (5-10 mg total) by mouth every 4  (four) hours as needed for moderate pain.   potassium chloride SA 20 MEQ tablet Commonly known as: KLOR-CON M Take 1 tablet (20 mEq total) by mouth daily.         Discharge home in stable condition Infant Feeding: Breast Infant Disposition:home with mother Discharge instruction: per After Visit Summary and Postpartum booklet. Activity: Advance as tolerated. Pelvic rest for 6 weeks.  Diet: routine diet Future Appointments:No future appointments. Follow up Visit:  Follow-up Information     Center for [facility] Follow up.   Specialty: Obstetrics and Gynecology Contact information: [address] [place] [address] [phone]                Message sent by Berton Lan 9/20  Please schedule this patient for a In person postpartum visit in 6 weeks with the following provider: Any provider. Additional Postpartum F/U:Incision check 1 week, mood check 1 week High risk pregnancy complicated by:  no prenatal care, severe anxiety Delivery mode:  C-Section, Low Transverse Anticipated Birth Control:   s/p bilateral salpingectomy   11/05/2022 Tracy Bores, MD

## 2022-11-04 NOTE — Progress Notes (Signed)
Upon ambulating patient for the first time, this RN noted that epidural catheter was still in place. Patient states she was not aware that it was still there. Spoke with Dr. Macon Large face to face asking if epidural can be removed. Dr. Macon Large ordered that epidural catheter may be removed. Called labor and delivery requesting RN to removed catheter. Earl Gala, Linda Hedges [place]

## 2022-11-04 NOTE — Progress Notes (Addendum)
Epidural catheter removed with tip intact.  Current platelet count is 218.

## 2022-11-04 NOTE — Progress Notes (Signed)
Post Partum Day 1  Subjective: Tracy Cooper is a 32 y.o. [phone] female s/p rLTCS. Patient has not ambulated, - BM, - flatus. She reports her bleeding is minimal and pain is manageable. She is tolerating PO intake and has no complaints today. Patient reports baby is breastfeeding well.   Objective: Blood pressure (!) 97/52, pulse (!) 54, temperature 98.5 F (36.9 C), temperature source Oral, resp. rate 18, height 5\' 2"  (1.575 m), weight 72.4 kg, last menstrual period 02/01/2022, SpO2 98%, unknown if currently breastfeeding.  Physical Exam:  General: alert and no distress Lochia: appropriate Uterine Fundus: not palpated due to patient holding baby Incision: not visualized due to patient holding baby  DVT Evaluation: No evidence of DVT seen on physical exam. Patient has SCD's on.   Recent Labs    11/03/22 1617 11/04/22 0456  HGB 10.4* 9.2*  HCT 32.2* 28.6*    Assessment/Plan: POD #1 - Patient doing well with no complaints 2. OB panel  -No PNC 3. Needs Tdap vaccination 4. Social work consult  - due to no [facility] because of patients anxiety 5. MOC: BS 6. MOF: breastfeeding    LOS: 1 day   Elspeth Cho, Student-PA 11/04/2022, 9:17 AM

## 2022-11-04 NOTE — Op Note (Signed)
[name] PROCEDURE DATE: 11/04/2022  PREOPERATIVE DIAGNOSES: Intrauterine pregnancy at [redacted]w[redacted]d weeks gestation;  prior CS x3, request for permanent sterilization  POSTOPERATIVE DIAGNOSES: The same  PROCEDURE: Repeat Low Transverse Cesarean Section Right salpingectomy Left partial salpingectomy  SURGEON:  Dr. Harvie Bridge  ASSISTANT:  Dr. Mittie Bodo  ANESTHESIOLOGY TEAM: Anesthesiologist: Mal Amabile, MD CRNA: Jennelle Human, CRNA; Rhymer, Doree Fudge, CRNA  INDICATIONS: Tracy Cooper is a 32 y.o. [phone] at [redacted]w[redacted]d here for cesarean section secondary to the indications listed under preoperative diagnoses; please see preoperative note for further details.  The risks of surgery were discussed with the patient including but were not limited to: bleeding which may require transfusion or reoperation; infection which may require antibiotics; injury to bowel, bladder, ureters or other surrounding organs; injury to the fetus; need for additional procedures including hysterectomy in the event of a life-threatening hemorrhage; formation of adhesions; placental abnormalities wth subsequent pregnancies; incisional problems; thromboembolic phenomenon and other postoperative/anesthesia complications.  The patient concurred with the proposed plan, giving informed written consent for the procedure.    FINDINGS:  Viable female infant in cephalic presentation.  Apgars 8 and 8.  Amniotic fluid: clear.  Intact placenta, three vessel cord. Significant bladder adhesions to lower uterine segment and up to round ligament on the right side. Needed to backfill bladder to confirm no injury intraoperatively. LUS very thin and entered while developing bladder flap. Moderate adhesive disease between the rectus, fascia, peritoneum and moderate subcutaneous adhesive disease.  Normal ovaries bilaterally.  Normal right fallopian tube.  Around 3cm of fallopian tube remaining on the right with a <1cm  hyperpigmented lesion at the distal end of the tubal stump.  ANESTHESIA:  combined spinal/epidural INTRAVENOUS FLUIDS: 2000 ml   ESTIMATED BLOOD LOSS: 421 ml URINE OUTPUT:  375 ml SPECIMENS: Placenta sent to L&D  COMPLICATIONS: None immediate  PROCEDURE IN DETAIL:  The patient preoperatively received intravenous antibiotics and had sequential compression devices applied to her lower extremities.  She was then taken to the operating room where  combined spinal/epidural anesthesia was found to be adequate . She was then placed in a dorsal supine position with a leftward tilt, and prepped and draped in a sterile manner.  A foley catheter was  placed into her bladder and attached to constant gravity.  After an adequate timeout was performed, a Pfannenstiel skin incision was made with scalpel and carried through to the underlying layer of fascia. The fascia was incised in the midline, and this incision was extended bluntly. At this point significant separation of the rectus was noted. The peritoneum was entered bluntly. Bladder adhesions appeared to be relatively high within the pelvis so peritoneum was stretched gently. At this time, Foley bulb could be brought up right to the edge of where the peritoneum had ben stretched but bladder appeared to be intact.   The Alexis self-retaining retractor was introduced into the abdominal cavity.  Attention was turned to the lower uterine segment. Bladder was essentially covering the lower uterine segment and filmy adhesions noted as high as the round ligament on the right side. A bladder flap was developed sharply. The plane between the bladder and uterus was gently bluntly developed and during this portion of the procedure, the uterus was entered. The hysterotomy was then stretched. Amniotomy revealed clear fluid.  The infant was successfully delivered, the cord was clamped and cut after one minute, and the infant was handed over to the awaiting neonatology team.  Uterine massage was  then administered, and the placenta delivered intact with a three-vessel cord. The uterus was exteriorized and then cleared of clots and debris.  The hysterotomy was closed with 0 Monocryl in a running locked fashion.   Attention was turned to salpingectomy. Left tubal stump noted with abnormality as described. Babcocks were used to elevate the tubal stump and the Ligasure device was used to seal and transect the mesosalpinx along the length of the tube. The ligasure was then used to transect the tube at the cornua. Attention was turned to the right side. Babcock clamps used to elevate the tube. The Ligasure was used to seal and transect the mesosalpinx along the length of the tube from the fimbriated end to the cornua. The ligasure was used to transect the right tube at the cornua. Surgical sites inspected and noted to be hemostatic bilaterally.   The uterus was returned to the abdomen and the retractor was removed. Bladder was carefully inspected. On the left side, there were redundant folds of peritoneum and it was unclear if the bladder had been superficially injured. The bladder was then backfilled with sterile milk. The borders of the bladder were well defined and at least 1cm away from the hysterotomy suture line. The initial area of concern was not involved with the bladder. No leakage was observed. Uterus was lifted and posterior cul de sac was examined with no leakage of sterile milk observed. Pelvis was inspected and hemostasis noted on all surfaces. Arista was applied over the hysterotomy, bladder flap, and rectus muscles.   The fascia was then closed using 0 Vicryl in a running fashion.  The subcutaneous layer was irrigated, and was found to be hemostatic, any areas of bleeding were cauterized with the bovie, and was reapproximated with 2-0 plain gut in a running fashion. The skin was closed with a 4-0 vicryl subcuticular stitch. The patient tolerated the procedure well.  Sponge, instrument and needle counts were correct x 3.  She was taken to the recovery room in stable condition.   Harvie Bridge, MD Obstetrician & Gynecologist, [facility] for [employer], [facility]

## 2022-11-05 ENCOUNTER — Other Ambulatory Visit (HOSPITAL_COMMUNITY): Payer: Self-pay

## 2022-11-05 MED ORDER — IBUPROFEN 600 MG PO TABS
600.0000 mg | ORAL_TABLET | Freq: Four times a day (QID) | ORAL | 0 refills | Status: DC
  Filled 2022-11-05: qty 30, 8d supply, fill #0

## 2022-11-05 MED ORDER — ACETAMINOPHEN 500 MG PO TABS
1000.0000 mg | ORAL_TABLET | Freq: Four times a day (QID) | ORAL | 0 refills | Status: DC
  Filled 2022-11-05: qty 100, 13d supply, fill #0

## 2022-11-05 MED ORDER — OXYCODONE HCL 5 MG PO TABS
5.0000 mg | ORAL_TABLET | ORAL | 0 refills | Status: DC | PRN
  Filled 2022-11-05: qty 20, 3d supply, fill #0

## 2022-11-05 NOTE — Lactation Note (Addendum)
This note was copied from a baby's chart. Lactation Consultation Note  Patient Name: Tracy Cooper [id number] Date: 11/05/2022 Age:31 hours Reason for consult: Follow-up assessment (weight loss of -9.20%, Birth Parent is currently breastfeeding and supplementing infant with formula.) Per Birth Parent infant,  is latching well this is her 4 th child, Birth Parent is latching infant first and then supplementing with formula. Per Birth Parent, feels her breast are getting more fuller today, she declined using the  hand and DEBP only wants to latch infant at breast and afterwards supplement infant with formula. Infant was breast feed at 11 am and afterwards given formula. Recently feeding Birth Parent only offered formula  infant consumed 38 mls at 1500 pm. LC encouraged Birth Parent to continue to latch infant 1st at the breast for all feedings and then  afterwards supplement with formula. Due Birth Parent not pumping LC concern if not latching all feedings and  doing  more of formula feeds. Birth Parent's milk may  come in, she may be at higher risk for engorgement. Birth Parent has "Feeding Guidelines Sheet " and knows if infant BF at (24-48 hours) to offer 12 mls or more per feeding and if infant does not latch to offer (15-30 mls) or more if infant wants it. Birth Parent knows to call RN/LC if she has any BF questions or concerns.   Maternal Data    Feeding Mother's Current Feeding Choice: Breast Milk and Formula  LATCH Score   Infant was recently formula feed only at 1500 pm , LC did not observe latch at current feeding.                  Lactation Tools Discussed/Used    Interventions Interventions: Education;Pace feeding;Guidelines for Milk Supply and Pumping Schedule Handout  Discharge Pump: DEBP;Personal  Consult Status Consult Status: Follow-up Date: 11/06/22 Follow-up type: In-patient    Frederico Hamman 11/05/2022, 4:34 PM

## 2022-11-05 NOTE — Progress Notes (Signed)
Subjective: Postpartum Day 2: Cesarean Delivery Patient reports incisional pain, tolerating PO, and no problems voiding.    Objective: Vital signs in last 24 hours: Temp:  [97.7 F (36.5 C)-98.5 F (36.9 C)] 97.7 F (36.5 C) (09/21 0512) Pulse Rate:  [52-67] 58 (09/21 0512) Resp:  [14-18] 14 (09/21 0512) BP: (106-114)/(56-74) 114/65 (09/21 0512) SpO2:  [97 %-100 %] 99 % (09/21 0512)  Physical Exam:  General: alert, cooperative, and appears stated age Lochia: appropriate Uterine Fundus: firm Incision: healing well DVT Evaluation: No evidence of DVT seen on physical exam.  Recent Labs    11/03/22 1617 11/04/22 0456  HGB 10.4* 9.2*  HCT 32.2* 28.6*    Assessment/Plan: Status post Cesarean section. Doing well postoperatively.  Continue current care. Would like discharge if baby can go  Reva Bores, MD 11/05/2022, 7:44 AM

## 2022-11-06 NOTE — Lactation Note (Signed)
This note was copied from a baby's chart. Lactation Consultation Note  Patient Name: Tracy Cooper Date: 11/06/2022 Age:31 hours, 7 % weight loss Reason for consult: Follow-up assessment;Term;Infant weight loss Per mom my milk is in and I pump 3 oz off this am.  Per mom  has been breast and formula.  LC recommended if the breast milk is available feed her the breast milk 1st.  LC reviewed BF D/C teaching and the [facility] resources.  LC reviewed the storage guidelines.   Maternal Data    Feeding Mother's Current Feeding Choice: Breast Milk and Formula Nipple Type: Slow - flow  LATCH Score LC unable to check the latch, dad feeding the baby a bottle    Lactation Tools Discussed/Used  Declined the pumps   Interventions Interventions: Breast feeding basics reviewed;Education;LC Services brochure  Discharge Discharge Education: Engorgement and breast care;Warning signs for feeding baby Pump: Personal;DEBP  Consult Status Consult Status: Complete Date: 11/06/22    [name] [facility] 11/06/2022, 10:15 AM

## 2022-11-07 LAB — SURGICAL PATHOLOGY

## 2022-11-08 ENCOUNTER — Telehealth: Payer: Self-pay | Admitting: General Practice

## 2022-11-08 NOTE — Telephone Encounter (Signed)
-----   Message from Lennart Pall sent at 11/07/2022 10:56 PM EDT ----- Please notify pt of benign path from her tubal since she won't get a notification about the mychart message.  Non-urgent. Thanks -KF

## 2022-11-08 NOTE — Telephone Encounter (Signed)
Called patient, no answer- left message to call us back for results.

## 2022-11-10 NOTE — Telephone Encounter (Signed)
Called patient to inform of benign surgical pathology.   Patient requested to cancel her incision check for tomorrow due to poor weather predictions. Changed to Monday at 3 pm.   She reports incision feels ok with some itching. Patient reports honeycomb dressing still intact. Reviewed can remove after a shower. Reviewed to wash body and allow soap to run over incision and rinse well reviewed to pat dry only.   Patient with no other questions or concerns.

## 2022-11-11 ENCOUNTER — Ambulatory Visit: Payer: BC Managed Care – PPO

## 2022-11-14 ENCOUNTER — Ambulatory Visit (INDEPENDENT_AMBULATORY_CARE_PROVIDER_SITE_OTHER): Payer: BC Managed Care – PPO

## 2022-11-14 VITALS — BP 119/78 | HR 72 | Ht 63.0 in | Wt 146.8 lb

## 2022-11-14 DIAGNOSIS — Z4889 Encounter for other specified surgical aftercare: Secondary | ICD-10-CM

## 2022-11-14 NOTE — Progress Notes (Signed)
Incision Check Visit  Tracy Cooper is here for incision check following repeat c-section on 11/03/22.   Assessment: Incision site was well approximated and healing well. No signs of infection--no redness, tenderness, swelling, or drainage from incision. Patient denied any pain.   Education: Reviewed good wound care and s/s of infection with patient.  Patient will follow up post partum visit . PP visit scheduled for 12/14/22.   Meryl Crutch, RN 11/14/2022  3:10 PM

## 2022-12-01 ENCOUNTER — Other Ambulatory Visit (HOSPITAL_BASED_OUTPATIENT_CLINIC_OR_DEPARTMENT_OTHER): Payer: Self-pay

## 2022-12-14 ENCOUNTER — Encounter: Payer: Self-pay | Admitting: Family Medicine

## 2022-12-14 ENCOUNTER — Ambulatory Visit: Payer: BC Managed Care – PPO | Admitting: Family Medicine

## 2022-12-14 ENCOUNTER — Other Ambulatory Visit: Payer: Self-pay

## 2022-12-14 NOTE — Progress Notes (Signed)
Post Partum Visit Note  Tracy Cooper is a 32 y.o. [id number] female who presents for a postpartum visit. She is 5 weeks postpartum following a repeat cesarean section.  I have fully reviewed the prenatal and intrapartum course. The delivery was at [redacted]w[redacted]d gestational weeks.  Anesthesia: spinal. Postpartum course has been uncomplicated. Baby is doing well. Baby is feeding by bottle - Similac . Bleeding thin lochia. Bowel function is normal. Bladder function is normal. Patient is sexually active. Contraception method is   bilateral salpingectomy . Postpartum depression screening: negative.  Upstream - 12/14/22 1333       Pregnancy Intention Screening   Does the patient want to become pregnant in the next year? No    Does the patient's partner want to become pregnant in the next year? No    Would the patient like to discuss contraceptive options today? No      Contraception Wrap Up   Current Method Withdrawal or Other Method   bilateral salpingectomy   End Method --   Bilateral Salpingectomy   Contraception Counseling Provided No            The pregnancy intention screening data noted above was reviewed. Potential methods of contraception were discussed. The patient elected to proceed with -- (Bilateral Salpingectomy).  Edinburgh Postnatal Depression Scale - 12/14/22 1335       Edinburgh Postnatal Depression Scale:  In the Past 7 Days   I have been able to laugh and see the funny side of things. 0    I have looked forward with enjoyment to things. 0    I have blamed myself unnecessarily when things went wrong. 2    I have been anxious or worried for no good reason. 1    I have felt scared or panicky for no good reason. 1    Things have been getting on top of me. 0    I have been so unhappy that I have had difficulty sleeping. 0    I have felt sad or miserable. 0    I have been so unhappy that I have been crying. 0    The thought of harming myself has occurred to me. 0     Edinburgh Postnatal Depression Scale Total 4             Health Maintenance Due  Topic Date Due   HIV Screening  Never done   Hepatitis C Screening  Never done   DTaP/Tdap/Td (1 - Tdap) Never done   Cervical Cancer Screening (HPV/Pap Cotest)  09/12/2022   INFLUENZA VACCINE  Never done   COVID-19 Vaccine (1 - 2023-24 season) Never done    The following portions of the patient's history were reviewed and updated as appropriate: allergies, current medications, past family history, past medical history, past social history, past surgical history, and problem list.  Review of Systems Pertinent items are noted in HPI.  Objective:  BP 130/85   Pulse 65   Wt 150 lb 8 oz (68.3 kg)   LMP 12/14/2022 (Exact Date)   Breastfeeding No   BMI 26.66 kg/m    General:  alert, cooperative, and appears stated age   Breasts:  normal  Lungs: clear to auscultation bilaterally  Heart:  regular rate and rhythm, S1, S2 normal, no murmur, click, rub or gallop  Abdomen: soft, non-tender; bowel sounds normal; no masses,  no organomegaly   Wound well approximated incision  GU exam:  not indicated  Assessment:   Normal postpartum exam.   Plan:   Essential components of care per ACOG recommendations:  1.  Mood and well being: Patient with negative depression screening today. Reviewed local resources for support.  - Patient tobacco use? No.   - hx of drug use? No.    2. Infant care and feeding:  -Patient currently breastmilk feeding? Yes. Reviewed importance of draining breast regularly to support lactation.  -Social determinants of health (SDOH) reviewed in EPIC. No concerns  3. Sexuality, contraception and birth spacing - Patient does not want a pregnancy in the next year.  Desired family size is 4 children.  - Reviewed reproductive life planning. Reviewed contraceptive methods based on pt preferences and effectiveness.  Patient desired Female Sterilization today.   - Discussed birth  spacing of 18 months  4. Sleep and fatigue -Encouraged family/partner/community support of 4 hrs of uninterrupted sleep to help with mood and fatigue  5. Physical Recovery  - Discussed patients delivery and complications. She describes her labor as good. - Patient had a C-section repeat; no problems after deliver.  Patient expressed understanding - Patient has urinary incontinence? No. - Patient is not safe to resume physical and sexual activity  6.  Health Maintenance - HM due items addressed Yes - Last pap smear  Diagnosis  Date Value Ref Range Status  09/12/2019   Final   - Negative for intraepithelial lesion or malignancy (NILM)   Pap smear done at today's visit.  -Breast Cancer screening indicated? No.   7. Chronic Disease/Pregnancy Condition follow up: None  Needs pap, on period on declined today  - PCP follow up  Federico Flake, MD Center for [facility], [facility]

## 2023-01-16 DIAGNOSIS — F411 Generalized anxiety disorder: Secondary | ICD-10-CM | POA: Diagnosis not present

## 2023-01-16 DIAGNOSIS — F410 Panic disorder [episodic paroxysmal anxiety] without agoraphobia: Secondary | ICD-10-CM | POA: Diagnosis not present

## 2023-01-16 DIAGNOSIS — F4312 Post-traumatic stress disorder, chronic: Secondary | ICD-10-CM | POA: Diagnosis not present

## 2023-04-03 DIAGNOSIS — F411 Generalized anxiety disorder: Secondary | ICD-10-CM | POA: Diagnosis not present

## 2023-04-03 DIAGNOSIS — F410 Panic disorder [episodic paroxysmal anxiety] without agoraphobia: Secondary | ICD-10-CM | POA: Diagnosis not present

## 2023-04-03 DIAGNOSIS — F4312 Post-traumatic stress disorder, chronic: Secondary | ICD-10-CM | POA: Diagnosis not present

## 2023-06-19 DIAGNOSIS — F4312 Post-traumatic stress disorder, chronic: Secondary | ICD-10-CM | POA: Diagnosis not present

## 2023-06-19 DIAGNOSIS — F411 Generalized anxiety disorder: Secondary | ICD-10-CM | POA: Diagnosis not present

## 2023-06-19 DIAGNOSIS — F410 Panic disorder [episodic paroxysmal anxiety] without agoraphobia: Secondary | ICD-10-CM | POA: Diagnosis not present

## 2023-06-29 DIAGNOSIS — F410 Panic disorder [episodic paroxysmal anxiety] without agoraphobia: Secondary | ICD-10-CM | POA: Diagnosis not present

## 2023-07-21 DIAGNOSIS — F410 Panic disorder [episodic paroxysmal anxiety] without agoraphobia: Secondary | ICD-10-CM | POA: Diagnosis not present

## 2023-09-28 DIAGNOSIS — F410 Panic disorder [episodic paroxysmal anxiety] without agoraphobia: Secondary | ICD-10-CM | POA: Diagnosis not present

## 2023-09-28 DIAGNOSIS — F4312 Post-traumatic stress disorder, chronic: Secondary | ICD-10-CM | POA: Diagnosis not present

## 2023-09-28 DIAGNOSIS — F411 Generalized anxiety disorder: Secondary | ICD-10-CM | POA: Diagnosis not present

## 2023-11-14 ENCOUNTER — Other Ambulatory Visit (HOSPITAL_COMMUNITY)
Admission: RE | Admit: 2023-11-14 | Discharge: 2023-11-14 | Disposition: A | Discharge status: Home / Self Care | Source: Ambulatory Visit | Attending: Obstetrics and Gynecology | Admitting: Obstetrics and Gynecology

## 2023-11-14 ENCOUNTER — Ambulatory Visit: Admitting: Obstetrics and Gynecology

## 2023-11-14 VITALS — HR 66 | Ht 62.0 in | Wt 166.0 lb

## 2023-11-14 DIAGNOSIS — Z01419 Encounter for gynecological examination (general) (routine) without abnormal findings: Secondary | ICD-10-CM | POA: Insufficient documentation

## 2023-11-14 NOTE — Progress Notes (Signed)
 ANNUAL EXAM Patient name: Tracy Cooper MRN [id number]  Date of birth: April 23, 1990 Chief Complaint:   Gynecologic exam  History of Present Illness:   Tracy Cooper is a 33 y.o. [phone] Caucasian female being seen today for a routine annual exam.  Current complaints: Cramping during ovulation, but ibuprofen  helps.  Patient's last menstrual period was 11/08/2023 (exact date). Lasts 4 days with 2 heavy days, changing pads 3x/day.  Last pap 08/23/2019. Results were: NILM w/ HRHPV not done. H/O abnormal pap: no     11/14/2023    9:28 AM 09/12/2019   10:41 AM  Depression screen PHQ 2/9  Decreased Interest 0 0  Down, Depressed, Hopeless 0 0  PHQ - 2 Score 0 0  Altered sleeping 0 1  Tired, decreased energy 1 1  Change in appetite 0 0  Feeling bad or failure about yourself  0 0  Trouble concentrating 0 0  Moving slowly or fidgety/restless 0 0  Suicidal thoughts 0 0  PHQ-9 Score 1 2        11/14/2023    9:28 AM 09/12/2019   10:42 AM  GAD 7 : Generalized Anxiety Score  Nervous, Anxious, on Edge 0 2  Control/stop worrying 0 2  Worry too much - different things 0 2  Trouble relaxing 0 1  Restless 0 1  Easily annoyed or irritable 1 1  Afraid - awful might happen  1  Total GAD 7 Score  10     Review of Systems:   Pertinent items are noted in HPI Denies any headaches, blurred vision, fatigue, shortness of breath, chest pain, abdominal pain, abnormal vaginal discharge/itching/odor/irritation, problems with periods, bowel movements, urination, or intercourse unless otherwise stated above.  Pertinent History Reviewed:  Reviewed past medical,surgical, social and family history.  Reviewed problem list, medications and allergies.  Physical Assessment:   Vitals:   11/14/23 0913  Pulse: 66  Weight: 166 lb (75.3 kg)  Height: 5' 2 (1.575 m)   Body mass index is 30.36 kg/m.   Physical Examination:  General appearance - well appearing, and in no distress Mental  status - alert, oriented to person, place, and time Psych:  She has a normal mood and affect Skin - warm and dry, normal color, no suspicious lesions noted Chest - effort normal, no problems with respiration noted Heart - normal rate and regular rhythm Neck:  midline trachea, no thyromegaly or nodules Breasts - breasts appear normal, no suspicious masses, no skin or nipple changes or  axillary nodes Abdomen - soft, nontender, nondistended, no masses or organomegaly Pelvic - VULVA: normal appearing vulva with no masses, tenderness or lesions   VAGINA: normal appearing vagina with normal color and discharge, no lesions   CERVIX: normal appearing cervix without discharge or lesions, no CMT Thin prep pap is done w/ HR HPV cotesting Extremities:  No swelling or varicosities noted  Chaperone present for exam  No results found for this or any previous visit (from the past 24 hours).  Assessment & Plan:      There are no diagnoses linked to this encounter. - Will follow up results of pap smear and manage accordingly. - Routine preventative health maintenance measures emphasized. - Please refer to After Visit Summary for other counseling recommendations.      Mammogram: @ 32yo, or sooner if problems Colonoscopy: @ 32yo, or sooner if problems  1. Encounter for annual routine gynecological examination (Primary) - PAP done today. - Encouraged patient to continue performing  self-breast exams   Meds: No orders of the defined types were placed in this encounter.   Follow-up: Return in about 1 year (around 11/13/2024) for Tracy Cooper Tracy Delores, Tracy Cooper

## 2023-11-15 LAB — CYTOLOGY - PAP
Comment: NEGATIVE
Diagnosis: NEGATIVE
High risk HPV: NEGATIVE

## 2023-11-16 ENCOUNTER — Ambulatory Visit: Payer: Self-pay | Admitting: Obstetrics and Gynecology

## 2023-11-20 NOTE — Telephone Encounter (Signed)
 Patient made aware of normal pap results.  Erminio DELENA Rumps, RN

## 2023-11-20 NOTE — Telephone Encounter (Signed)
-----   Message from [facility] sent at 11/16/2023  9:22 AM EDT ----- Can we let her know her pap smear is normal, her mychart not active  ----- Message ----- From: Interface, [facility] Sent: 11/15/2023   3:50 PM EDT To: Nidia LITTIE Daring, FNP

## 2023-12-01 ENCOUNTER — Ambulatory Visit: Admitting: Family Medicine

## 2023-12-01 ENCOUNTER — Encounter: Payer: Self-pay | Admitting: Family Medicine

## 2023-12-01 VITALS — BP 114/62 | HR 80 | Ht 62.0 in | Wt 171.0 lb

## 2023-12-01 DIAGNOSIS — E538 Deficiency of other specified B group vitamins: Secondary | ICD-10-CM | POA: Diagnosis not present

## 2023-12-01 DIAGNOSIS — R739 Hyperglycemia, unspecified: Secondary | ICD-10-CM

## 2023-12-01 DIAGNOSIS — Z0000 Encounter for general adult medical examination without abnormal findings: Secondary | ICD-10-CM | POA: Diagnosis not present

## 2023-12-01 DIAGNOSIS — F419 Anxiety disorder, unspecified: Secondary | ICD-10-CM | POA: Diagnosis not present

## 2023-12-01 LAB — B12 AND FOLATE PANEL
Folate: 18.8 ng/mL (ref >5.9)
Vitamin B-12: 138 pg/mL — ABNORMAL LOW (ref 211–911)

## 2023-12-01 LAB — LIPID PANEL
Cholesterol: 164 mg/dL (ref 0–200)
HDL: 57.1 mg/dL (ref >39.00)
LDL Cholesterol: 86 mg/dL (ref 0–99)
NonHDL: 106.76
Total CHOL/HDL Ratio: 3
Triglycerides: 104 mg/dL (ref 0.0–149.0)
VLDL: 20.8 mg/dL (ref 0.0–40.0)

## 2023-12-01 LAB — CBC WITH DIFFERENTIAL/PLATELET
Basophils Absolute: 0 K/uL (ref 0.0–0.1)
Basophils Relative: 0.4 % (ref 0.0–3.0)
Eosinophils Absolute: 0.1 K/uL (ref 0.0–0.7)
Eosinophils Relative: 1 % (ref 0.0–5.0)
HCT: 36.1 % (ref 36.0–46.0)
Hemoglobin: 11.5 g/dL — ABNORMAL LOW (ref 12.0–15.0)
Lymphocytes Relative: 28.6 % (ref 12.0–46.0)
Lymphs Abs: 1.6 K/uL (ref 0.7–4.0)
MCHC: 32 g/dL (ref 30.0–36.0)
MCV: 78.6 fl (ref 78.0–100.0)
Monocytes Absolute: 0.6 K/uL (ref 0.1–1.0)
Monocytes Relative: 10.2 % (ref 3.0–12.0)
Neutro Abs: 3.3 K/uL (ref 1.4–7.7)
Neutrophils Relative %: 59.8 % (ref 43.0–77.0)
Platelets: 258 K/uL (ref 150.0–400.0)
RBC: 4.59 Mil/uL (ref 3.87–5.11)
RDW: 16.6 % — ABNORMAL HIGH (ref 11.5–15.5)
WBC: 5.6 K/uL (ref 4.0–10.5)

## 2023-12-01 LAB — COMPREHENSIVE METABOLIC PANEL WITH GFR
ALT: 9 U/L (ref 0–35)
AST: 12 U/L (ref 0–37)
Albumin: 4.6 g/dL (ref 3.5–5.2)
Alkaline Phosphatase: 47 U/L (ref 39–117)
BUN: 12 mg/dL (ref 6–23)
CO2: 29 meq/L (ref 19–32)
Calcium: 9.2 mg/dL (ref 8.4–10.5)
Chloride: 103 meq/L (ref 96–112)
Creatinine, Ser: 0.82 mg/dL (ref 0.40–1.20)
GFR: 94.64 mL/min (ref >60.00)
Glucose, Bld: 70 mg/dL (ref 70–99)
Potassium: 4.3 meq/L (ref 3.5–5.1)
Sodium: 139 meq/L (ref 135–145)
Total Bilirubin: 0.3 mg/dL (ref 0.2–1.2)
Total Protein: 7.8 g/dL (ref 6.0–8.3)

## 2023-12-01 LAB — HEMOGLOBIN A1C: Hgb A1c MFr Bld: 5.9 % (ref 4.6–6.5)

## 2023-12-01 LAB — TSH: TSH: 1.39 u[IU]/mL (ref 0.35–5.50)

## 2023-12-01 MED ORDER — ESCITALOPRAM OXALATE 10 MG PO TABS: 10.0000 mg | ORAL_TABLET | Freq: Every day | ORAL | 1 refills | Status: AC

## 2023-12-01 NOTE — Progress Notes (Signed)
 Complete physical exam  Patient: Tracy Cooper   DOB: 10-27-1990   33 y.o. Female  MRN: [id number]  Subjective:    Chief Complaint  Patient presents with   Establish Care    Waleska Bertha Lokken is a 33 y.o. female who presents today for a complete physical exam. She reports consuming a general diet. She is very active during the day.  She generally feels well. She reports sleeping well. She does not have additional problems to discuss today.   Currently lives with: husband, children Acute concerns or interim problems since last visit: no - She has been doing well on Lexapro  10 mg daily and regular counseling. She would like me to refill her Lexapro .   Vision concerns: no Dental concerns: no STD concerns: no  ETOH use: no Nicotine use: no Recreational drugs/illegal substances: no    Females:  She is currently  sexually active  Contraception choices are: s/p bilateral salpingectomy  LMP: due in 3 days       Most recent fall risk assessment:    12/01/2023   10:55 AM  Fall Risk   Falls in the past year? 0  Number falls in past yr: 0  Injury with Fall? 0  Risk for fall due to : No Fall Risks  Follow up Falls evaluation completed     Most recent depression screenings:    12/01/2023   10:55 AM 11/14/2023    9:28 AM  PHQ 2/9 Scores  PHQ - 2 Score 0 0  PHQ- 9 Score 3 1            Patient Care Team: Almarie Waddell NOVAK, NP as PCP - General (Family Medicine)   Outpatient Medications Prior to Visit  Medication Sig   ibuprofen  (ADVIL ) 200 MG tablet Take 200 mg by mouth every 6 (six) hours as needed.   [DISCONTINUED] escitalopram  (LEXAPRO ) 10 MG tablet Take 10 mg by mouth daily.   [DISCONTINUED] acetaminophen  (TYLENOL ) 500 MG tablet Take 2 tablets (1,000 mg total) by mouth every 6 (six) hours. (Patient not taking: Reported on 11/14/2023)   [DISCONTINUED] escitalopram  (LEXAPRO ) 5 MG tablet Take 5 mg by mouth daily. (Patient taking differently: Take 10  mg by mouth daily.)   [DISCONTINUED] ibuprofen  (ADVIL ) 600 MG tablet Take 1 tablet (600 mg total) by mouth every 6 (six) hours.   [DISCONTINUED] oxyCODONE  (OXY IR/ROXICODONE ) 5 MG immediate release tablet Take 1-2 tablets (5-10 mg total) by mouth every 4 (four) hours as needed for moderate pain. (Patient not taking: Reported on 11/14/2023)   [DISCONTINUED] potassium chloride  SA (KLOR-CON ) 20 MEQ tablet Take 1 tablet (20 mEq total) by mouth daily. (Patient not taking: Reported on 11/14/2023)   No facility-administered medications prior to visit.    ROS All review of systems negative except what is listed in the HPI        Objective:     BP 114/62   Pulse 80   Ht 5' 2 (1.575 m)   Wt 171 lb (77.6 kg)   LMP 11/08/2023 (Exact Date)   SpO2 98%   BMI 31.28 kg/m    Physical Exam Vitals reviewed.  Constitutional:      General: She is not in acute distress.    Appearance: Normal appearance. She is not ill-appearing.  HENT:     Head: Normocephalic and atraumatic.     Right Ear: Tympanic membrane normal.     Left Ear: Tympanic membrane normal.     Nose: Nose normal.  Mouth/Throat:     Mouth: Mucous membranes are moist.     Pharynx: Oropharynx is clear.  Eyes:     Extraocular Movements: Extraocular movements intact.     Conjunctiva/sclera: Conjunctivae normal.     Pupils: Pupils are equal, round, and reactive to light.  Cardiovascular:     Rate and Rhythm: Normal rate and regular rhythm.     Pulses: Normal pulses.     Heart sounds: Normal heart sounds.  Pulmonary:     Effort: Pulmonary effort is normal.     Breath sounds: Normal breath sounds.  Abdominal:     General: Abdomen is flat. Bowel sounds are normal. There is no distension.     Palpations: Abdomen is soft. There is no mass.     Tenderness: There is no abdominal tenderness. There is no right CVA tenderness, left CVA tenderness, guarding or rebound.  Genitourinary:    Comments: Deferred exam Musculoskeletal:         General: Normal range of motion.     Cervical back: Normal range of motion and neck supple. No tenderness.     Right lower leg: No edema.     Left lower leg: No edema.  Lymphadenopathy:     Cervical: No cervical adenopathy.  Skin:    General: Skin is warm and dry.     Capillary Refill: Capillary refill takes less than 2 seconds.  Neurological:     General: No focal deficit present.     Mental Status: She is alert and oriented to person, place, and time. Mental status is at baseline.  Psychiatric:        Mood and Affect: Mood normal.        Behavior: Behavior normal.        Thought Content: Thought content normal.        Judgment: Judgment normal.          No results found for any visits on 12/01/23.     Assessment & Plan:    Routine Health Maintenance and Physical Exam Discussed health promotion and safety including diet and exercise recommendations, dental health, and injury prevention. Tobacco cessation if applicable. Seat belts, sunscreen, smoke detectors, etc.    There is no immunization history for the selected administration types on file for this patient.  Health Maintenance  Topic Date Due   HIV Screening  Never done   Hepatitis C Screening  Never done   COVID-19 Vaccine (1 - 2025-26 season) 12/15/2023 (Originally 10/16/2023)   Influenza Vaccine  05/14/2024 (Originally 09/15/2023)   DTaP/Tdap/Td (1 - Tdap) 11/30/2024 (Originally 06/26/2010)   Hepatitis B Vaccines 19-59 Average Risk (1 of 3 - 19+ 3-dose series) 11/30/2024 (Originally 06/26/2010)   HPV VACCINES (1 - 3-dose SCDM series) 11/30/2024 (Originally 06/26/2018)   Cervical Cancer Screening (HPV/Pap Cotest)  11/13/2028   Pneumococcal Vaccine  Aged Out   Meningococcal B Vaccine  Aged Out        Problem List Items Addressed This Visit       Active Problems   Anxiety   PDMP reviewed. No S/HI. Stable on current Lexapro . Refills sent in. Continue counseling.       Relevant Medications   escitalopram   (LEXAPRO ) 10 MG tablet   Other Visit Diagnoses       Annual physical exam    -  Primary   Relevant Orders   B12 and Folate Panel   CBC with Differential/Platelet   Comprehensive metabolic panel with GFR   Lipid panel  TSH     Encounter for medical examination to establish care         B12 deficiency       Relevant Orders   B12 and Folate Panel     Hyperglycemia       Relevant Orders   Hemoglobin A1c          PATIENT COUNSELING:    Recommend that most people either abstain from alcohol or drink within safe limits (<=14/week and <=4 drinks/occasion for males, <=7/weeks and <= 3 drinks/occasion for females) and that the risk for alcohol disorders and other health effects rises proportionally with the number of drinks per week and how often a drinker exceeds daily limits.   Diet: Recommend to adjust caloric intake to maintain or achieve ideal body weight, to reduce intake of dietary saturated fat and total fat, to limit sodium intake by avoiding high sodium foods and not adding table salt, and to maintain adequate dietary potassium and calcium preferably from fresh fruits, vegetables, and low-fat dairy products.   Emphasized the importance of regular exercise.  Injury prevention: Recommend seatbelts, safety helmets, smoke detector, etc..   Dental health: Recommend regular tooth brushing, flossing, and dental visits.       Return for chronic disease management 4-6 months.     Waddell KATHEE Mon, NP

## 2023-12-01 NOTE — Assessment & Plan Note (Signed)
 PDMP reviewed. No S/HI. Stable on current Lexapro . Refills sent in. Continue counseling.

## 2023-12-04 ENCOUNTER — Ambulatory Visit: Payer: Self-pay | Admitting: Family Medicine

## 2023-12-04 ENCOUNTER — Other Ambulatory Visit

## 2023-12-04 DIAGNOSIS — E538 Deficiency of other specified B group vitamins: Secondary | ICD-10-CM

## 2023-12-05 ENCOUNTER — Ambulatory Visit

## 2023-12-09 LAB — INTRINSIC FACTOR BLOCKING ANTIBODY: Intrinsic Factor: NEGATIVE

## 2023-12-11 ENCOUNTER — Ambulatory Visit: Payer: Self-pay | Admitting: Family Medicine

## 2023-12-11 DIAGNOSIS — E538 Deficiency of other specified B group vitamins: Secondary | ICD-10-CM

## 2024-01-24 ENCOUNTER — Ambulatory Visit: Payer: Self-pay

## 2024-01-24 NOTE — Telephone Encounter (Signed)
 This RN attempted to contact pt with no answer. A voicemail was left with call back number.    Copied from CRM #[id number]. Topic: Clinical - Red Word Triage >> Jan 24, 2024 10:00 AM Roselie BROCKS wrote: [facility] that prompted transfer to Nurse Triage: Patient states her whole family is sick, and now she feels sick. Severe wet productive cough, feels like glass in her throat when she coughs.severe headache, fever >> Jan 24, 2024 10:10 AM Roselie BROCKS wrote: Patient disconnected while holding for Nurse Triage . No answer at call back.

## 2024-01-25 ENCOUNTER — Ambulatory Visit: Admitting: Family Medicine

## 2024-01-25 NOTE — Progress Notes (Incomplete)
° °  Acute Office Visit  Subjective:  Patient ID: Tracy Cooper, female    DOB: 1990-11-23  Age: 33 y.o. MRN: [id number]  CC: No chief complaint on file.     HPI Tracy Cooper is here for cough and sinus symptoms.      Past Medical History:  Diagnosis Date   Anxiety     Past Surgical History:  Procedure Laterality Date   CESAREAN SECTION     x 3   CESAREAN SECTION N/A 11/03/2022   Procedure: CESAREAN SECTION;  Surgeon: Erik Kieth BROCKS, MD;  Location: [facility] LD ORS;  Service: Obstetrics;  Laterality: N/A;   DENTAL SURGERY     teeth extraction   DIAGNOSTIC LAPAROSCOPY WITH REMOVAL OF ECTOPIC PREGNANCY N/A 08/19/2019   Procedure: DIAGNOSTIC LAPAROSCOPY WITH Left Salpingectomy and  REMOVAL OF ECTOPIC PREGNANCY;  Surgeon: Eveline Lynwood MATSU, MD;  Location: [facility] OR;  Service: Gynecology;  Laterality: N/A;   UNILATERAL SALPINGECTOMY Right 11/03/2022   Procedure: UNILATERAL SALPINGECTOMY;  Surgeon: Erik Kieth BROCKS, MD;  Location: [facility] ORS;  Service: Obstetrics;  Laterality: Right;    Family History  Problem Relation Age of Onset   COPD Mother    Kidney disease Maternal Grandmother    Kidney disease Maternal Grandfather     Social History   Socioeconomic History   Marital status: Married    Spouse name: Not on file   Number of children: Not on file   Years of education: Not on file   Highest education level: Not on file  Occupational History   Not on file  Tobacco Use   Smoking status: Former    Types: Cigarettes   Smokeless tobacco: Never  Vaping Use   Vaping status: Former   Substances: Nicotine, Flavoring  Substance and Sexual Activity   Alcohol use: Never   Drug use: Never   Sexual activity: Not Currently  Other Topics Concern   Not on file  Social History Narrative   Not on file   Social Drivers of Health   Financial Resource Strain: Not on file  Food Insecurity: No Food Insecurity (09/12/2019)   Hunger Vital Sign    Worried About  Running Out of Food in the Last Year: Never true    Ran Out of Food in the Last Year: Never true  Transportation Needs: No Transportation Needs (09/12/2019)   PRAPARE - Administrator, Civil Service (Medical): No    Lack of Transportation (Non-Medical): No  Physical Activity: Not on file  Stress: Not on file  Social Connections: Not on file  Intimate Partner Violence: Not on file    ROS All ROS negative except what is listed in the HPI.   Objective:   Today's Vitals: There were no vitals taken for this visit.  Physical Exam  Assessment & Plan:   Problem List Items Addressed This Visit   None     Follow-up: No follow-ups on file.   Waddell FURY Almarie, DNP, FNP-C  I,Tracy Cooper,acting as a neurosurgeon for Waddell KATHEE Almarie, NP.,have documented all relevant documentation on the behalf of Waddell KATHEE Almarie, NP.   I, Waddell KATHEE Almarie, NP, have reviewed all documentation for this visit. The documentation on 01/25/2024 for the exam, diagnosis, procedures, and orders are all accurate and complete.

## 2024-01-25 NOTE — Telephone Encounter (Signed)
 Appt scheduled today

## 2024-05-31 ENCOUNTER — Ambulatory Visit: Admitting: Family Medicine
